# Patient Record
Sex: Female | Born: 1980 | Hispanic: Yes | Marital: Married | Smoking: Never smoker
Health system: Southern US, Community
[De-identification: ages and names within clinical notes are randomized; demographics above are authoritative.]

## PROBLEM LIST (undated history)

## (undated) ENCOUNTER — Inpatient Hospital Stay (HOSPITAL_COMMUNITY): Payer: Self-pay

## (undated) DIAGNOSIS — I10 Essential (primary) hypertension: Secondary | ICD-10-CM

## (undated) DIAGNOSIS — O24419 Gestational diabetes mellitus in pregnancy, unspecified control: Secondary | ICD-10-CM

## (undated) HISTORY — PX: CHOLECYSTECTOMY: SHX55

## (undated) HISTORY — DX: Essential (primary) hypertension: I10

---

## 2008-12-10 ENCOUNTER — Encounter: Admission: RE | Admit: 2008-12-10 | Discharge: 2008-12-10 | Payer: Self-pay | Admitting: Obstetrics and Gynecology

## 2008-12-26 ENCOUNTER — Inpatient Hospital Stay (HOSPITAL_COMMUNITY): Admission: AD | Admit: 2008-12-26 | Discharge: 2008-12-26 | Payer: Self-pay | Admitting: Obstetrics and Gynecology

## 2008-12-30 ENCOUNTER — Inpatient Hospital Stay (HOSPITAL_COMMUNITY): Admission: AD | Admit: 2008-12-30 | Discharge: 2008-12-30 | Payer: Self-pay | Admitting: Obstetrics and Gynecology

## 2008-12-31 ENCOUNTER — Inpatient Hospital Stay (HOSPITAL_COMMUNITY): Admission: AD | Admit: 2008-12-31 | Discharge: 2008-12-31 | Payer: Self-pay | Admitting: Obstetrics and Gynecology

## 2009-01-20 ENCOUNTER — Ambulatory Visit (HOSPITAL_COMMUNITY): Admission: RE | Admit: 2009-01-20 | Discharge: 2009-01-20 | Payer: Self-pay | Admitting: Obstetrics & Gynecology

## 2009-01-27 ENCOUNTER — Encounter: Payer: Self-pay | Admitting: Obstetrics & Gynecology

## 2009-01-27 ENCOUNTER — Inpatient Hospital Stay (HOSPITAL_COMMUNITY): Admission: AD | Admit: 2009-01-27 | Discharge: 2009-01-30 | Payer: Self-pay | Admitting: Obstetrics & Gynecology

## 2010-03-09 ENCOUNTER — Encounter (INDEPENDENT_AMBULATORY_CARE_PROVIDER_SITE_OTHER): Payer: Self-pay

## 2010-03-09 ENCOUNTER — Inpatient Hospital Stay (HOSPITAL_COMMUNITY): Admission: EM | Admit: 2010-03-09 | Discharge: 2010-03-10 | Payer: Self-pay | Admitting: Emergency Medicine

## 2010-12-28 LAB — URINALYSIS, ROUTINE W REFLEX MICROSCOPIC
Bilirubin Urine: NEGATIVE
Bilirubin Urine: NEGATIVE
Glucose, UA: NEGATIVE mg/dL
Hgb urine dipstick: NEGATIVE
Ketones, ur: 15 mg/dL — AB
Ketones, ur: >80 mg/dL — AB
Protein, ur: NEGATIVE mg/dL
Specific Gravity, Urine: 1.018 (ref 1.005–1.030)
Urobilinogen, UA: 0.2 mg/dL (ref 0.0–1.0)

## 2010-12-28 LAB — DIFFERENTIAL
Basophils Absolute: 0 10*3/uL (ref 0.0–0.1)
Basophils Relative: 0 % (ref 0–1)
Lymphocytes Relative: 11 % — ABNORMAL LOW (ref 12–46)
Lymphs Abs: 1.4 10*3/uL (ref 0.7–4.0)
Monocytes Absolute: 0.4 10*3/uL (ref 0.1–1.0)
Monocytes Relative: 4 % (ref 3–12)
Neutro Abs: 10.7 10*3/uL — ABNORMAL HIGH (ref 1.7–7.7)
Neutrophils Relative %: 85 % — ABNORMAL HIGH (ref 43–77)

## 2010-12-28 LAB — POCT PREGNANCY, URINE: Preg Test, Ur: NEGATIVE

## 2010-12-28 LAB — COMPREHENSIVE METABOLIC PANEL
ALT: 98 U/L — ABNORMAL HIGH (ref 0–35)
AST: 37 U/L (ref 0–37)
Chloride: 107 mEq/L (ref 96–112)
Glucose, Bld: 128 mg/dL — ABNORMAL HIGH (ref 70–99)
Total Protein: 7.2 g/dL (ref 6.0–8.3)

## 2010-12-28 LAB — URINE MICROSCOPIC-ADD ON

## 2010-12-28 LAB — CBC
HCT: 40.3 % (ref 36.0–46.0)
Hemoglobin: 13.6 g/dL (ref 12.0–15.0)
MCV: 81.3 fL (ref 78.0–100.0)
RDW: 13.9 % (ref 11.5–15.5)
WBC: 12.7 10*3/uL — ABNORMAL HIGH (ref 4.0–10.5)

## 2011-01-20 LAB — RPR: RPR Ser Ql: NONREACTIVE

## 2011-01-20 LAB — COMPREHENSIVE METABOLIC PANEL
ALT: 27 U/L (ref 0–35)
AST: 25 U/L (ref 0–37)
AST: 29 U/L (ref 0–37)
Alkaline Phosphatase: 184 U/L — ABNORMAL HIGH (ref 39–117)
Alkaline Phosphatase: 191 U/L — ABNORMAL HIGH (ref 39–117)
BUN: 9 mg/dL (ref 6–23)
BUN: 9 mg/dL (ref 6–23)
Creatinine, Ser: 0.58 mg/dL (ref 0.4–1.2)
GFR calc non Af Amer: >60 mL/min (ref >60)
GFR calc non Af Amer: >60 mL/min (ref >60)
Glucose, Bld: 92 mg/dL (ref 70–99)
Potassium: 4.4 mEq/L (ref 3.5–5.1)
Total Bilirubin: 0.3 mg/dL (ref 0.3–1.2)
Total Bilirubin: 0.4 mg/dL (ref 0.3–1.2)
Total Protein: 5.6 g/dL — ABNORMAL LOW (ref 6.0–8.3)
Total Protein: 5.9 g/dL — ABNORMAL LOW (ref 6.0–8.3)

## 2011-01-20 LAB — URINALYSIS, DIPSTICK ONLY
Bilirubin Urine: NEGATIVE
Glucose, UA: NEGATIVE mg/dL
Ketones, ur: 15 mg/dL — AB
Protein, ur: NEGATIVE mg/dL
Urobilinogen, UA: 0.2 mg/dL (ref 0.0–1.0)
pH: 6.5 (ref 5.0–8.0)

## 2011-01-20 LAB — CBC
MCV: 79.5 fL (ref 78.0–100.0)
Platelets: 255 10*3/uL (ref 150–400)
Platelets: 307 10*3/uL (ref 150–400)
RBC: 4.86 MIL/uL (ref 3.87–5.11)
WBC: 11.4 10*3/uL — ABNORMAL HIGH (ref 4.0–10.5)
WBC: 24 10*3/uL — ABNORMAL HIGH (ref 4.0–10.5)

## 2011-01-20 LAB — URINALYSIS, ROUTINE W REFLEX MICROSCOPIC
Bilirubin Urine: NEGATIVE
Glucose, UA: NEGATIVE mg/dL
Hgb urine dipstick: NEGATIVE
Specific Gravity, Urine: <1.005 — ABNORMAL LOW (ref 1.005–1.030)

## 2011-01-20 LAB — CCBB MATERNAL DONOR DRAW

## 2011-01-20 LAB — URIC ACID: Uric Acid, Serum: 3.7 mg/dL (ref 2.4–7.0)

## 2011-01-20 LAB — URINE MICROSCOPIC-ADD ON

## 2011-01-21 LAB — COMPREHENSIVE METABOLIC PANEL
ALT: 24 U/L (ref 0–35)
AST: 23 U/L (ref 0–37)
Alkaline Phosphatase: 185 U/L — ABNORMAL HIGH (ref 39–117)
GFR calc Af Amer: >60 mL/min (ref >60)
Glucose, Bld: 90 mg/dL (ref 70–99)
Potassium: 3.9 mEq/L (ref 3.5–5.1)
Sodium: 137 mEq/L (ref 135–145)
Total Protein: 6.3 g/dL (ref 6.0–8.3)

## 2011-01-21 LAB — CBC
Hemoglobin: 12.8 g/dL (ref 12.0–15.0)
RBC: 4.93 MIL/uL (ref 3.87–5.11)
RDW: 14.9 % (ref 11.5–15.5)

## 2011-01-21 LAB — LACTATE DEHYDROGENASE: LDH: 142 U/L (ref 94–250)

## 2011-07-07 IMAGING — US US ABDOMEN COMPLETE
1 series · 14 of 25 positions shown · non-contrast
Comparison: None.

CLINICAL DATA: Abdominal pain.

COMPLETE ABDOMINAL ULTRASOUND

[Series 1: us abdomen complete · 0.32mm/px · 14 of 64 slices shown]
[im 1/64]
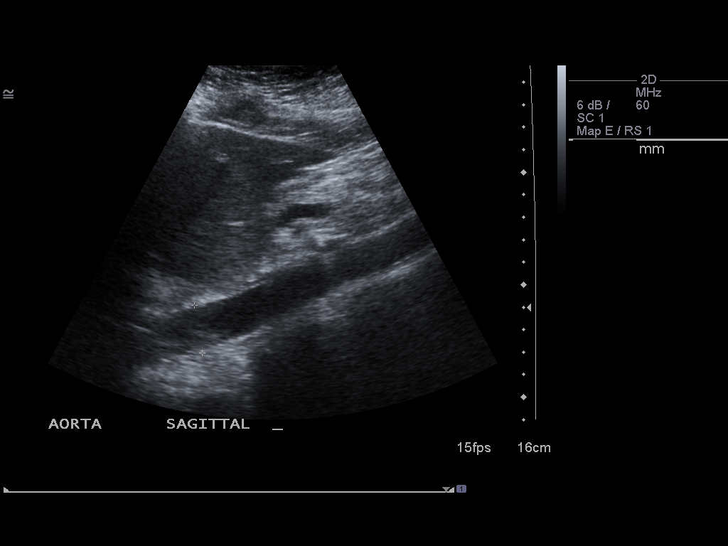
[im 6/64]
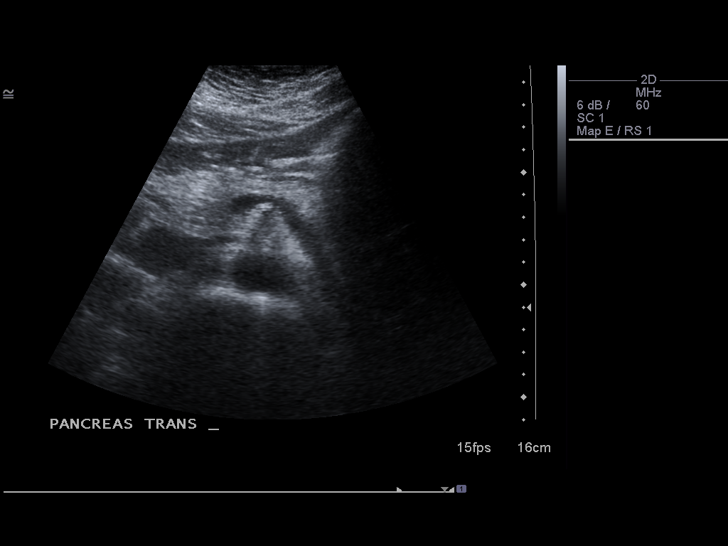
[im 11/64]
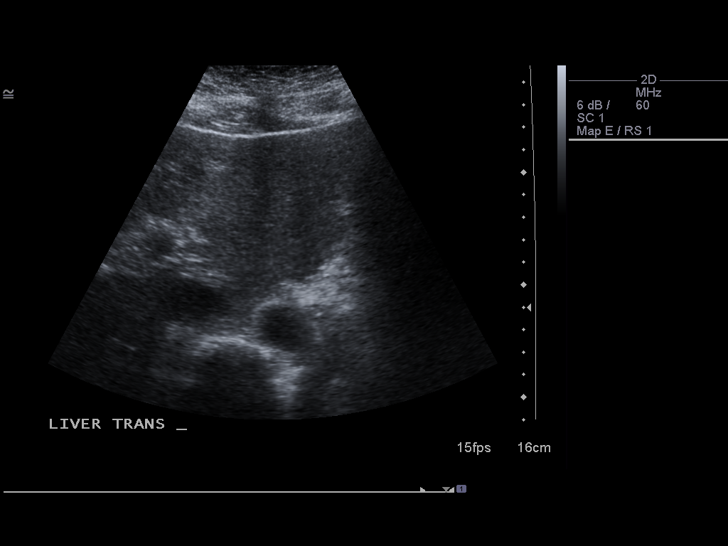
[im 16/64]
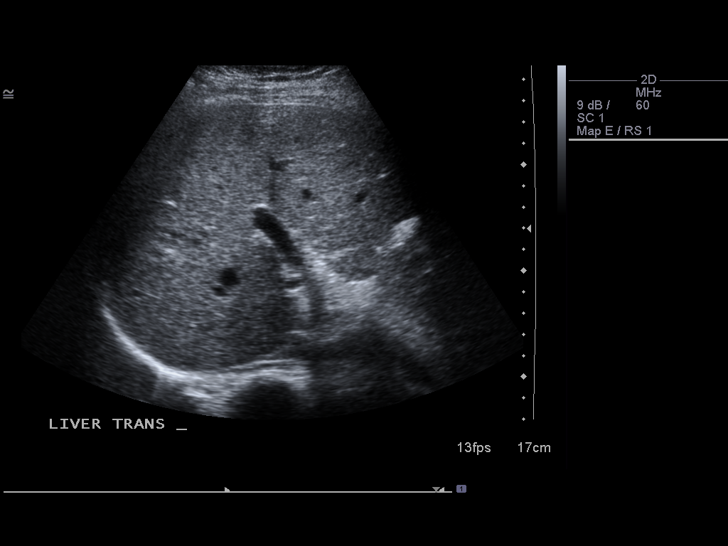
[im 22/64]
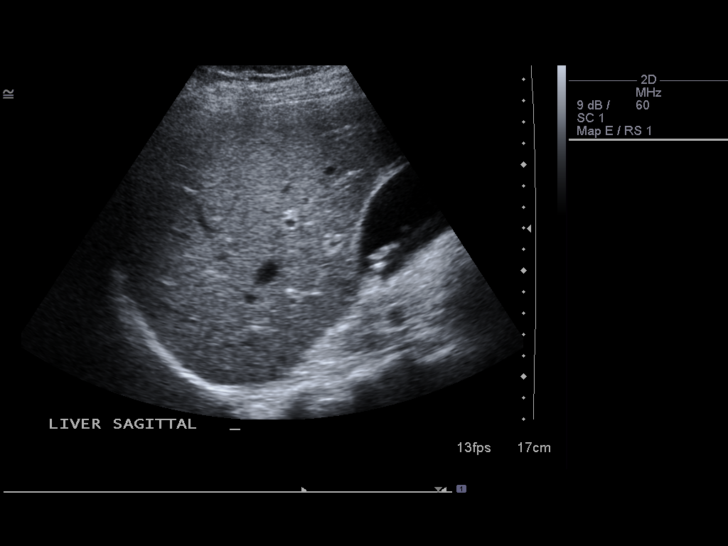
[im 24/64]
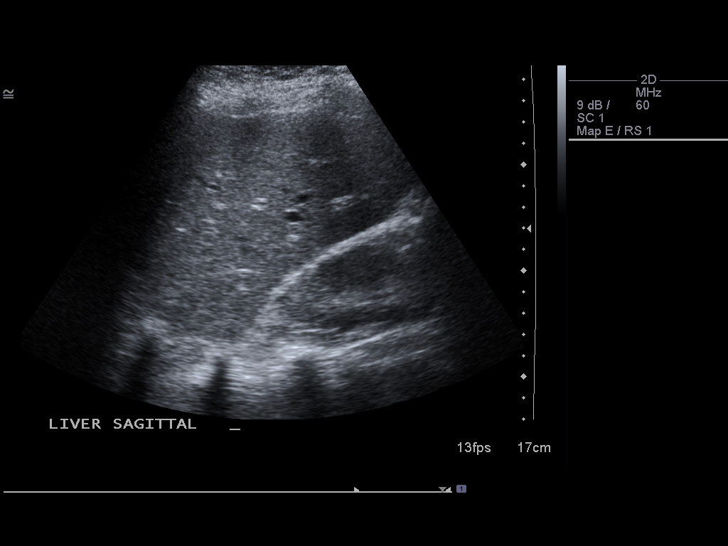
[im 29/64]
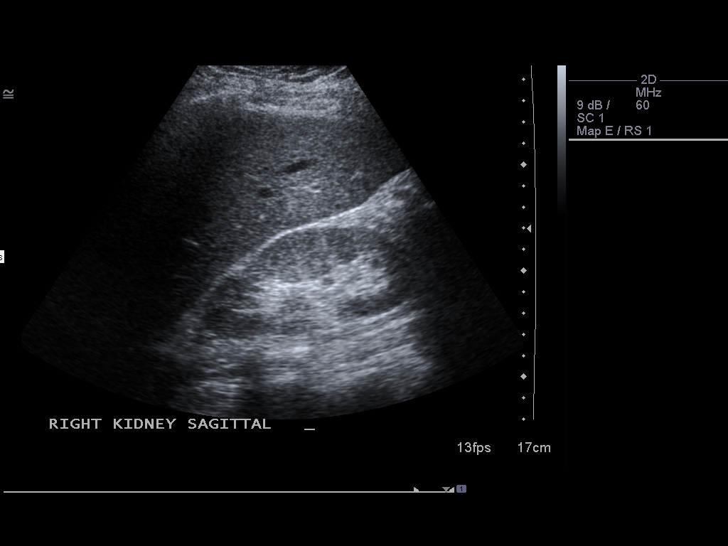
[im 35/64]
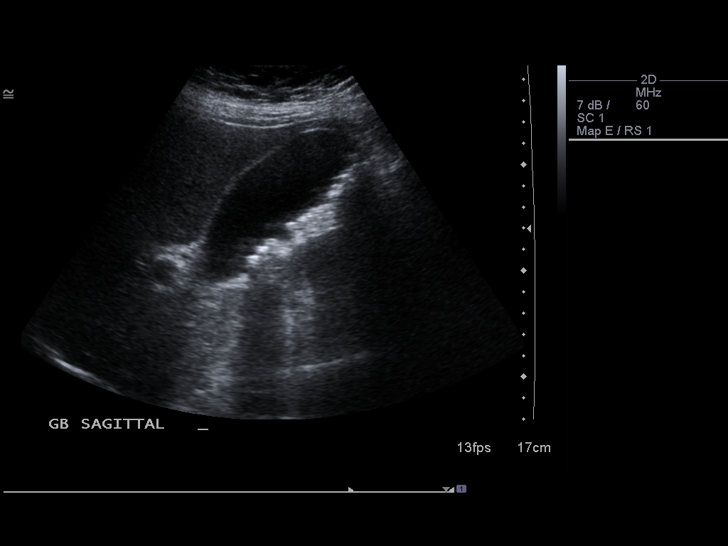
[im 40/64]
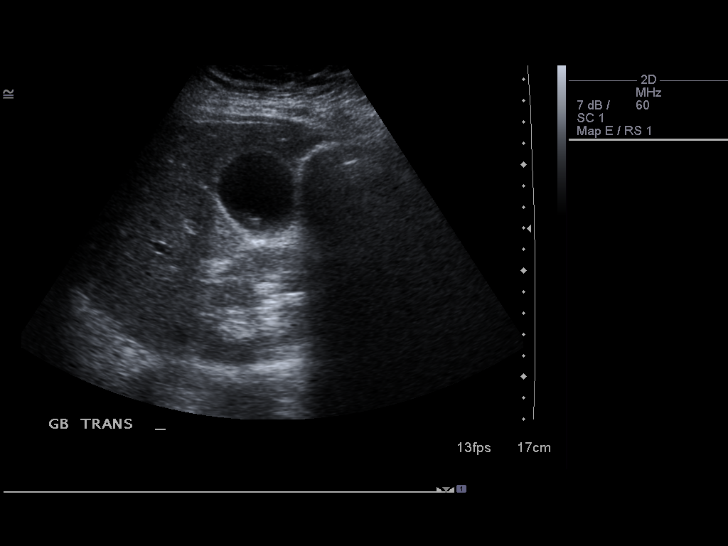
[im 43/64]
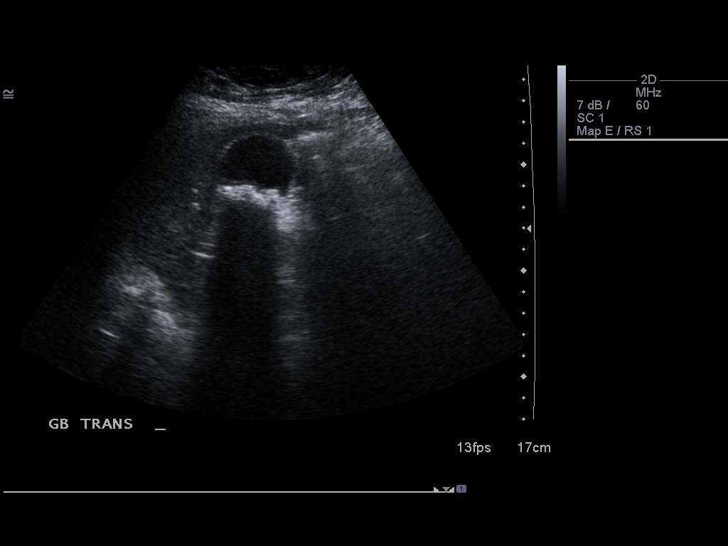
[im 48/64]
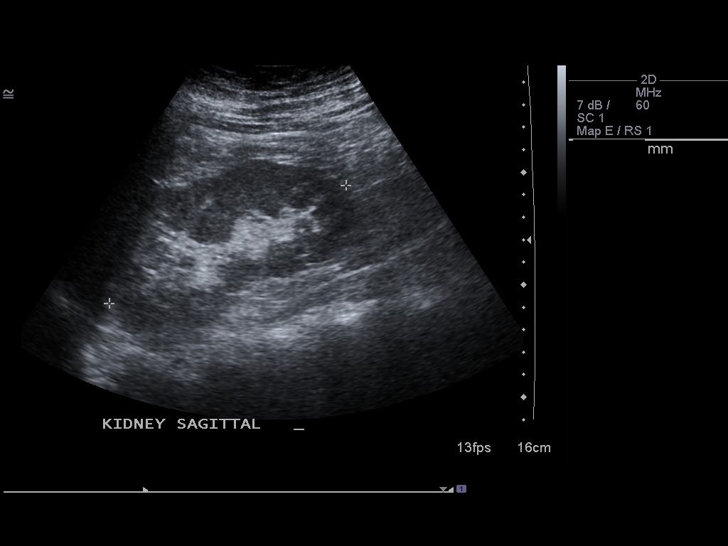
[im 53/64]
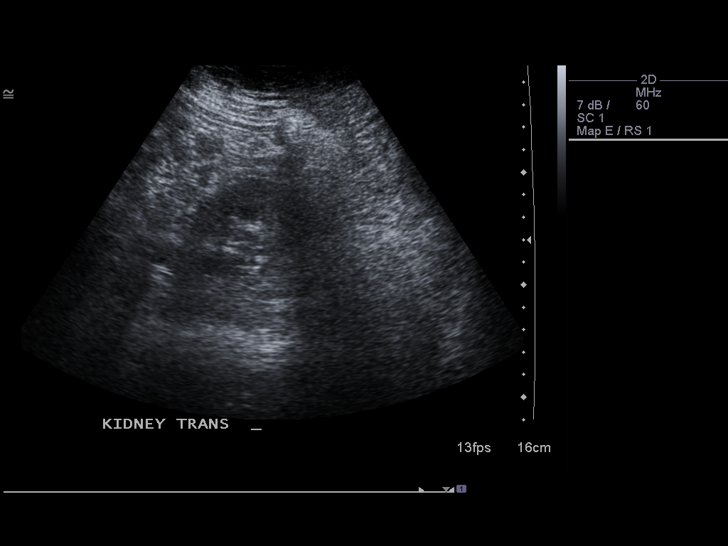
[im 58/64]
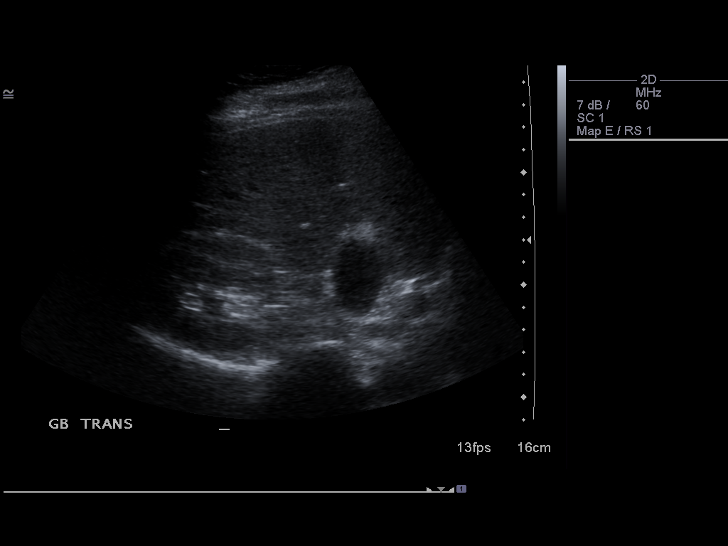
[im 64/64]
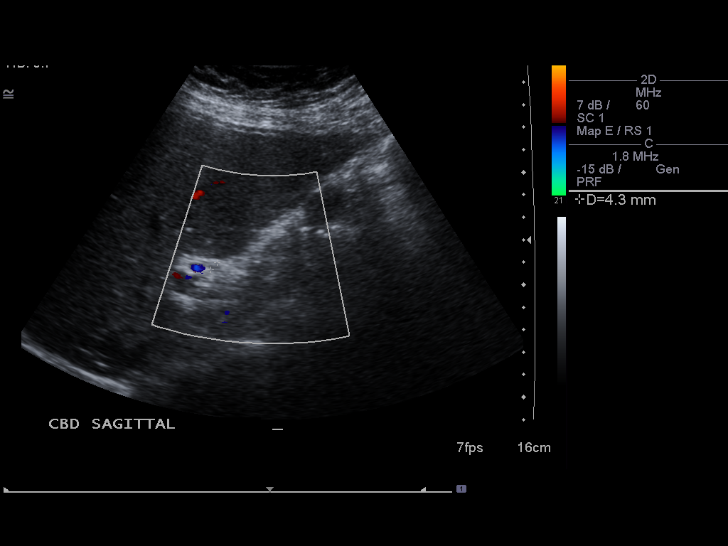

[14 of 25 positions shown; findings below may reference images not displayed]

FINDINGS: Gallbladder:  Multiple small stones are seen within the
gallbladder.  There is no pericholecystic fluid or wall thickening.
Sonographer reports right upper quadrant tenderness.

Common bile duct:  Measures 4.3 mm, normal.

Liver:  No focal lesion identified.  Within normal limits in
parenchymal echogenicity.

IVC:  Appears normal.

Pancreas:  No focal abnormality seen.

Spleen:  Measures 5.8 cm and appears normal.

Right Kidney:  Measures 11.4 cm and appears normal.

Left Kidney:  Measures 11.8 cm and appears normal.

Abdominal aorta:  No aneurysm.
IMPRESSION: Small gallstones.  The sonographer reports right upper quadrant
tenderness but there is no pericholecystic fluid or wall thickening
as is typically seen in cholecystitis.

## 2011-07-07 IMAGING — RF DG CHOLANGIOGRAM OPERATIVE
1 series · 5 of 5 positions shown · non-contrast
Comparison: Right upper quadrant ultrasound 03/09/2010.

Fluoroscopy time of 0.3 minutes was utilized.

CLINICAL DATA: 29-year-old female undergoing laparoscopic
cholecystectomy.

INTRAOPERATIVE CHOLANGIOGRAM
TECHNIQUE: Cholangiographic images from the C-arm fluoroscopic
device were submitted for interpretation post-operatively.  Please
see the procedural report for the amount of contrast and the
fluoroscopy time utilized.

[Series 1: run · 3 acquisitions, 5 frames shown]
[im 1/3]
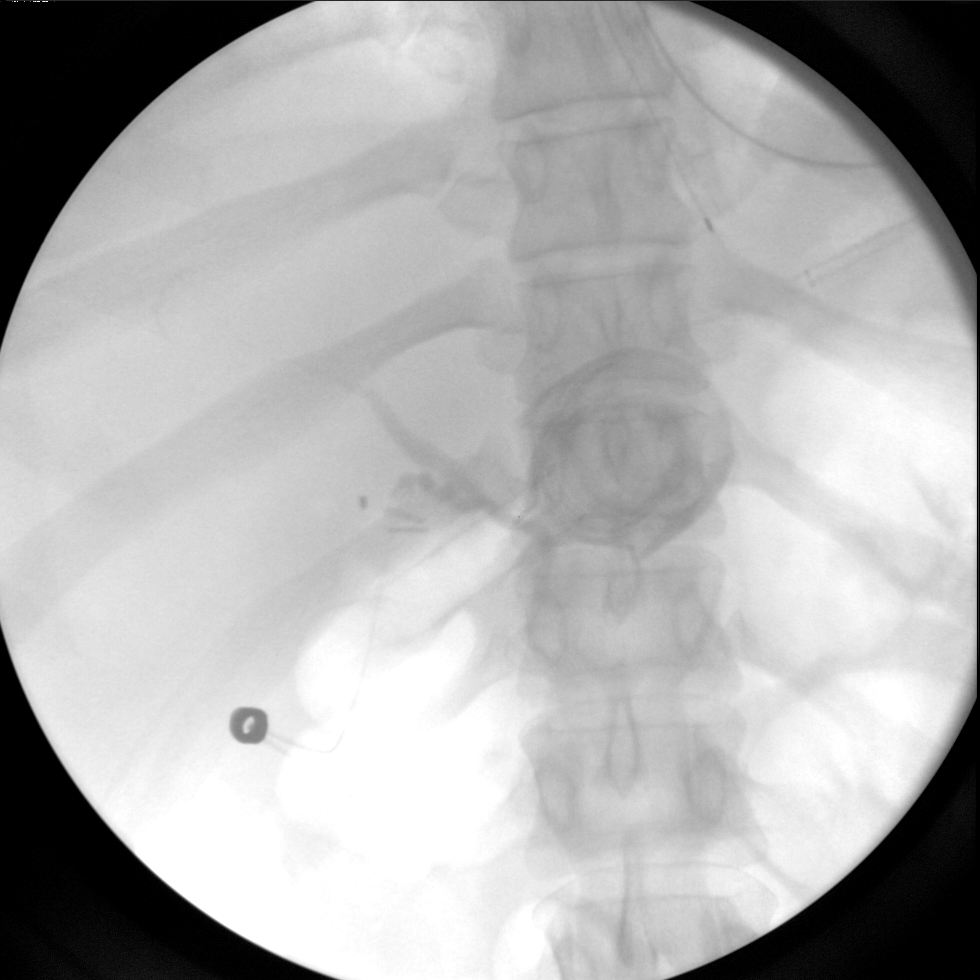
[im 1/3]
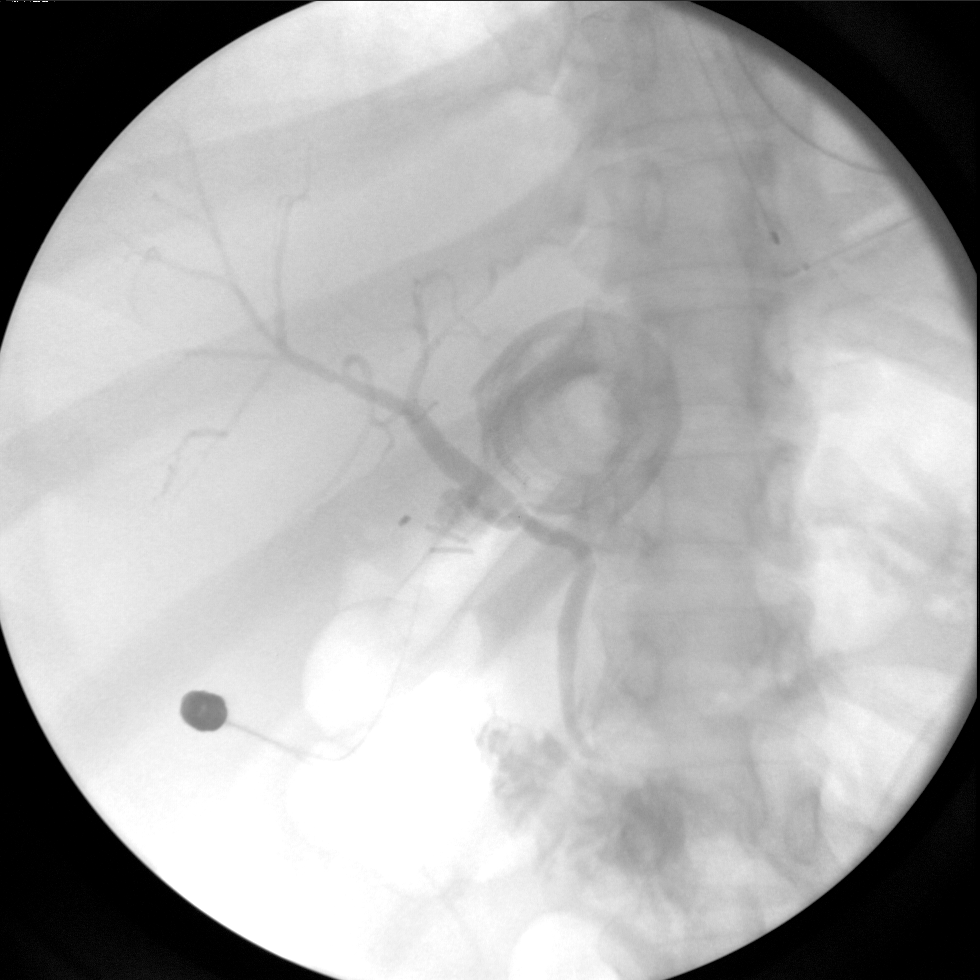
[im 1/3]
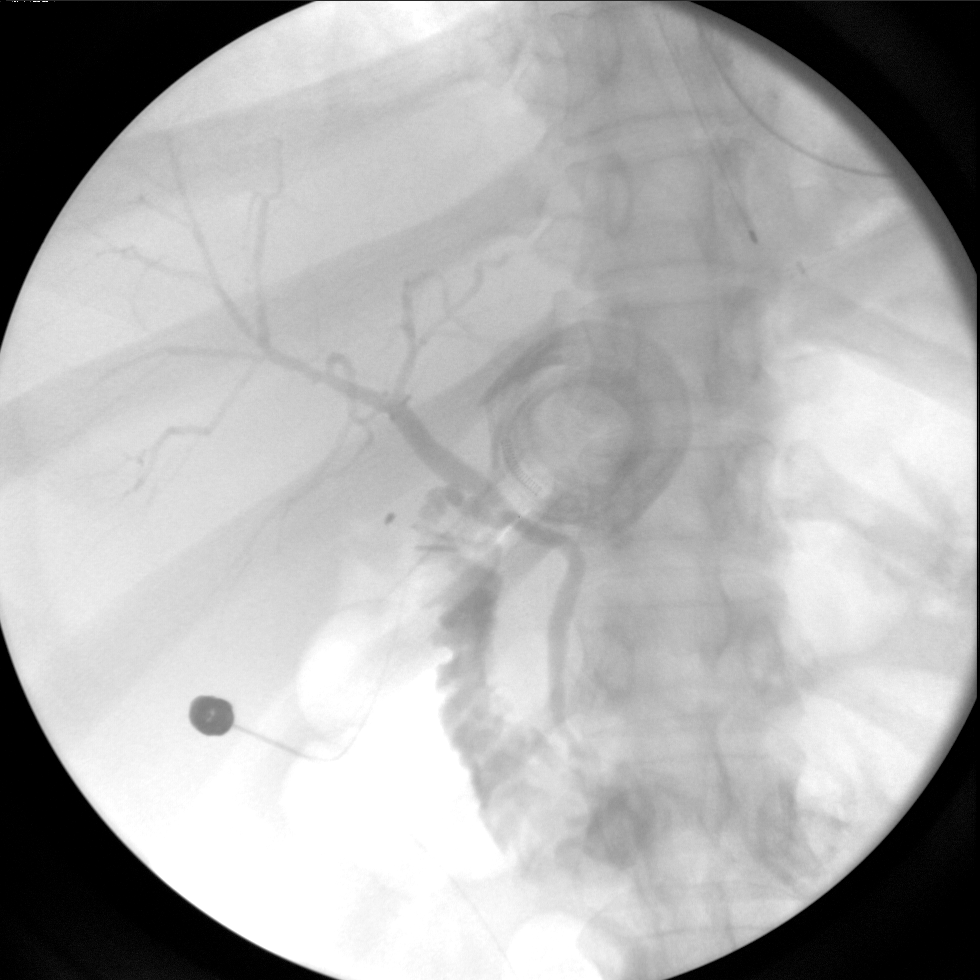
[im 2/3]
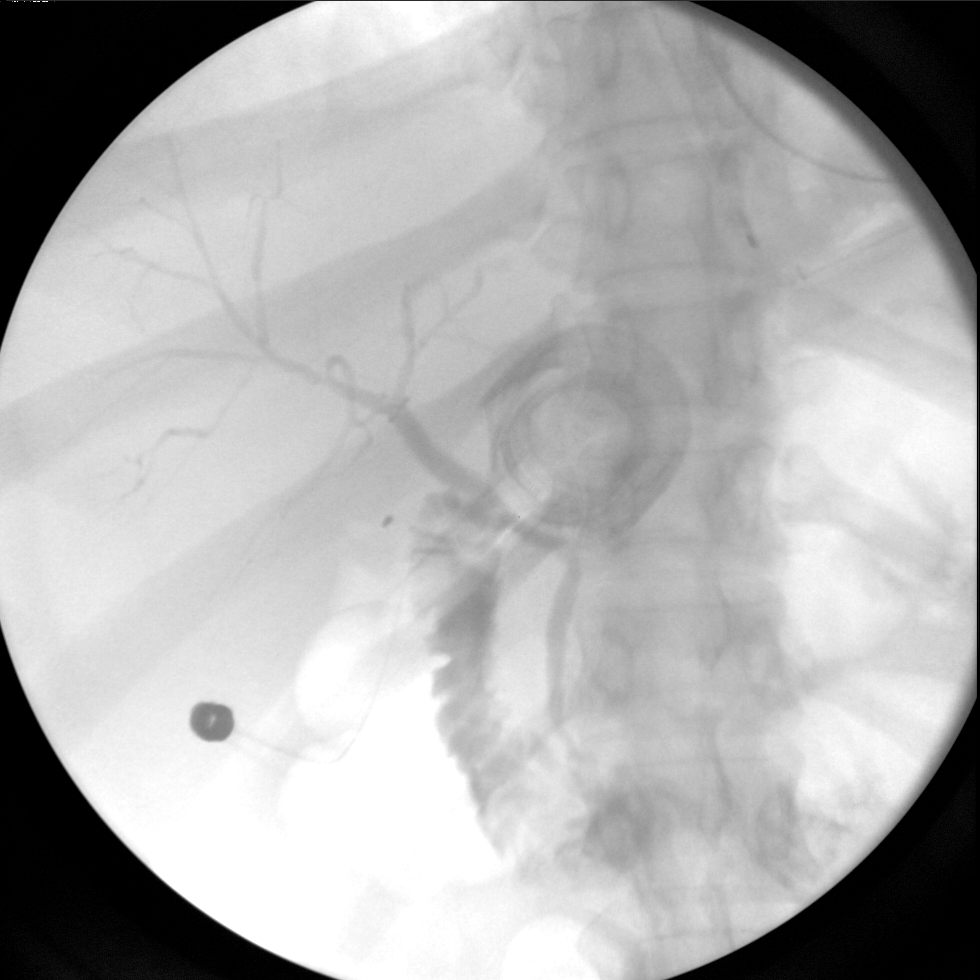
[im 3/3]
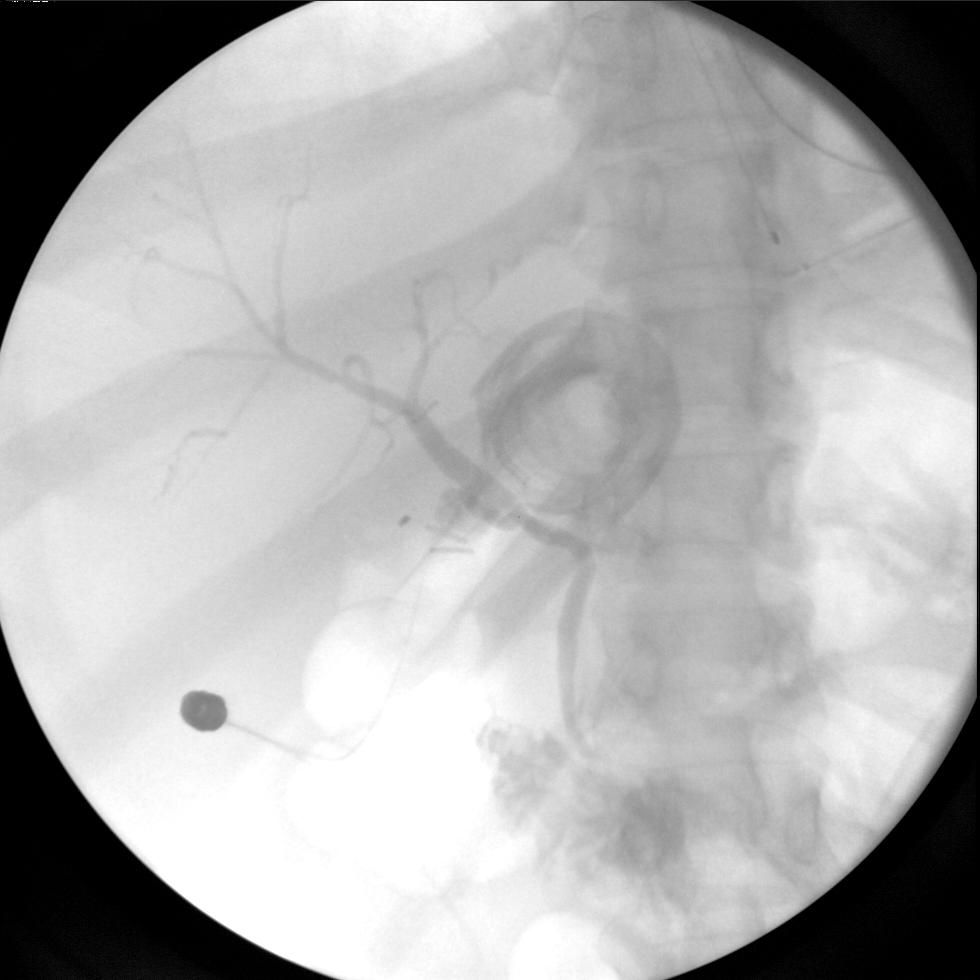

[5 of 5 positions shown; findings below may reference images not displayed]

FINDINGS: Multiple intraoperative coned-down fluoroscopic views of
the right upper quadrant.  Surgical clips at the cystic duct which
has cannulated.  Contrast is injected.  Normal intra and
extrahepatic biliary tree caliber.  Prompt contrast transit to the
duodenum.  No filling defect.
IMPRESSION: Negative intraoperative cholangiogram.

## 2012-08-05 ENCOUNTER — Ambulatory Visit: Payer: Self-pay | Admitting: Family Medicine

## 2012-08-05 VITALS — BP 152/89 | HR 82 | Temp 98.6°F | Resp 16 | Ht 64.0 in | Wt 191.0 lb

## 2012-08-05 DIAGNOSIS — R030 Elevated blood-pressure reading, without diagnosis of hypertension: Secondary | ICD-10-CM

## 2012-08-05 DIAGNOSIS — M5380 Other specified dorsopathies, site unspecified: Secondary | ICD-10-CM

## 2012-08-05 DIAGNOSIS — M62830 Muscle spasm of back: Secondary | ICD-10-CM

## 2012-08-05 MED ORDER — CYCLOBENZAPRINE HCL 10 MG PO TABS: 10.0000 mg | ORAL_TABLET | Freq: Three times a day (TID) | ORAL | Status: DC | PRN

## 2012-08-05 MED ORDER — DIAZEPAM 5 MG PO TABS: 5.0000 mg | ORAL_TABLET | Freq: Every evening | ORAL | Status: DC | PRN

## 2012-08-05 MED ORDER — METHYLPREDNISOLONE ACETATE 80 MG/ML IJ SUSP
120.0000 mg | Freq: Once | INTRAMUSCULAR | Status: AC
  Administered 2012-08-05: 120 mg via INTRAMUSCULAR

## 2012-08-05 MED ORDER — KETOROLAC TROMETHAMINE 30 MG/ML IJ SOLN
30.0000 mg | Freq: Once | INTRAMUSCULAR | Status: AC
  Administered 2012-08-05: 30 mg via INTRAMUSCULAR

## 2012-08-05 NOTE — Progress Notes (Signed)
  Subjective:    Patient ID: Gabriela Donovan, female    DOB: Feb 14, 1981, 31 y.o.   MRN: [id number]  HPI  Larey Seat onto her bottom coming down the stairs 2 mos ago and pain is currently worsening, hurts to sit, walk, lift. Has a 99 yo daughter  Pain is in low back only, so bad can't straighten legs at all but does not radiate from bilateral low back.  No past medical history on file.  Review of Systems  Constitutional: Negative for fever and chills.  Gastrointestinal: Positive for constipation. Negative for nausea, vomiting, abdominal pain and diarrhea.  Genitourinary: Negative for urgency, frequency, decreased urine volume and difficulty urinating.  Musculoskeletal: Positive for myalgias and back pain. Negative for joint swelling, arthralgias and gait problem.  Hematological: Negative for adenopathy. Does not bruise/bleed easily.      BP 152/89  Pulse 82  Temp 98.6 F (37 C)  Resp 16  Ht 5\' 4"  (1.626 m)  Wt 191 lb (86.637 kg)  BMI 32.79 kg/m2  LMP 07/15/2012 Objective:   Physical Exam  Constitutional: She is oriented to person, place, and time. She appears well-developed and well-nourished. No distress.  HENT:  Head: Normocephalic and atraumatic.  Cardiovascular: Normal rate, regular rhythm and normal heart sounds.   Pulmonary/Chest: Effort normal and breath sounds normal. No respiratory distress.  Abdominal: Soft. Bowel sounds are normal. She exhibits no distension.  Musculoskeletal: She exhibits no edema.       Lumbar back: She exhibits decreased range of motion, tenderness and spasm. She exhibits no bony tenderness, no swelling and no deformity.  Neurological: She is alert and oriented to person, place, and time. She has normal strength and normal reflexes. No sensory deficit. She exhibits abnormal muscle tone.  Reflex Scores:      Patellar reflexes are 2+ on the right side and 2+ on the left side.      Achilles reflexes are 2+ on the right side and 2+ on the left side. Skin: Skin is  warm and dry. No rash noted. She is not diaphoretic. No erythema.  Psychiatric: She has a normal mood and affect. Her behavior is normal.          Assessment & Plan:  Low back strain w/ muscle spasm - cont otc nsaids w/ prn flexeril. Few valium given for evening.  depomedrol 120 IM and toradol 30mg  IM x 1 now.  Rev importance of heat and stretching. 1. Elevated blood pressure    Likely due to pain, recheck at f/u  2.

## 2012-08-05 NOTE — Patient Instructions (Signed)
Make sure you apply heat for at least 15 minutes at least 3 times a day followed by gentle stretching.  Low Back Strain with Rehab A strain is an injury in which a tendon or muscle is torn. The muscles and tendons of the lower back are vulnerable to strains. However, these muscles and tendons are very strong and require a great force to be injured. Strains are classified into three categories. Grade 1 strains cause pain, but the tendon is not lengthened. Grade 2 strains include a lengthened ligament, due to the ligament being stretched or partially ruptured. With grade 2 strains there is still function, although the function may be decreased. Grade 3 strains involve a complete tear of the tendon or muscle, and function is usually impaired. SYMPTOMS   Pain in the lower back.  Pain that affects one side more than the other.  Pain that gets worse with movement and may be felt in the hip, buttocks, or back of the thigh.  Muscle spasms of the muscles in the back.  Swelling along the muscles of the back.  Loss of strength of the back muscles.  Crackling sound (crepitation) when the muscles are touched. CAUSES  Lower back strains occur when a force is placed on the muscles or tendons that is greater than they can handle. Common causes of injury include:  Prolonged overuse of the muscle-tendon units in the lower back, usually from incorrect posture.  A single violent injury or force applied to the back. RISK INCREASES WITH:  Sports that involve twisting forces on the spine or a lot of bending at the waist (football, rugby, weightlifting, bowling, golf, tennis, speed skating, racquetball, swimming, running, gymnastics, diving).  Poor strength and flexibility.  Failure to warm up properly before activity.  Family history of lower back pain or disk disorders.  Previous back injury or surgery (especially fusion).  Poor posture with lifting, especially heavy objects.  Prolonged sitting,  especially with poor posture. PREVENTION   Learn and use proper posture when sitting or lifting (maintain proper posture when sitting, lift using the knees and legs, not at the waist).  Warm up and stretch properly before activity.  Allow for adequate recovery between workouts.  Maintain physical fitness:  Strength, flexibility, and endurance.  Cardiovascular fitness. PROGNOSIS  If treated properly, lower back strains usually heal within 6 weeks. RELATED COMPLICATIONS   Recurring symptoms, resulting in a chronic problem.  Chronic inflammation, scarring, and partial muscle-tendon tear.  Delayed healing or resolution of symptoms.  Prolonged disability. TREATMENT  Treatment first involves the use of ice and medicine, to reduce pain and inflammation. The use of strengthening and stretching exercises may help reduce pain with activity. These exercises may be performed at home or with a therapist. Severe injuries may require referral to a therapist for further evaluation and treatment, such as ultrasound. Your caregiver may advise that you wear a back brace or corset, to help reduce pain and discomfort. Often, prolonged bed rest results in greater harm then benefit. Corticosteroid injections may be recommended. However, these should be reserved for the most serious cases. It is important to avoid using your back when lifting objects. At night, sleep on your back on a firm mattress with a pillow placed under your knees. If non-surgical treatment is unsuccessful, surgery may be needed.  MEDICATION   If pain medicine is needed, nonsteroidal anti-inflammatory medicines (aspirin and ibuprofen), or other minor pain relievers (acetaminophen), are often advised.  Do not take pain medicine for  7 days before surgery.  Prescription pain relievers may be given, if your caregiver thinks they are needed. Use only as directed and only as much as you need.  Ointments applied to the skin may be  helpful.  Corticosteroid injections may be given by your caregiver. These injections should be reserved for the most serious cases, because they may only be given a certain number of times. HEAT AND COLD  Cold treatment (icing) should be applied for 10 to 15 minutes every 2 to 3 hours for inflammation and pain, and immediately after activity that aggravates your symptoms. Use ice packs or an ice massage.  Heat treatment may be used before performing stretching and strengthening activities prescribed by your caregiver, physical therapist, or athletic trainer. Use a heat pack or a warm water soak. SEEK MEDICAL CARE IF:   Symptoms get worse or do not improve in 2 to 4 weeks, despite treatment.  You develop numbness, weakness, or loss of bowel or bladder function.  New, unexplained symptoms develop. (Drugs used in treatment may produce side effects.) EXERCISES  RANGE OF MOTION (ROM) AND STRETCHING EXERCISES - Low Back Strain Most people with lower back pain will find that their symptoms get worse with excessive bending forward (flexion) or arching at the lower back (extension). The exercises which will help resolve your symptoms will focus on the opposite motion.  Your physician, physical therapist or athletic trainer will help you determine which exercises will be most helpful to resolve your lower back pain. Do not complete any exercises without first consulting with your caregiver. Discontinue any exercises which make your symptoms worse until you speak to your caregiver.  If you have pain, numbness or tingling which travels down into your buttocks, leg or foot, the goal of the therapy is for these symptoms to move closer to your back and eventually resolve. Sometimes, these leg symptoms will get better, but your lower back pain may worsen. This is typically an indication of progress in your rehabilitation. Be very alert to any changes in your symptoms and the activities in which you participated  in the 24 hours prior to the change. Sharing this information with your caregiver will allow him/her to most efficiently treat your condition.  These exercises may help you when beginning to rehabilitate your injury. Your symptoms may resolve with or without further involvement from your physician, physical therapist or athletic trainer. While completing these exercises, remember:  Restoring tissue flexibility helps normal motion to return to the joints. This allows healthier, less painful movement and activity.  An effective stretch should be held for at least 30 seconds.  A stretch should never be painful. You should only feel a gentle lengthening or release in the stretched tissue. FLEXION RANGE OF MOTION AND STRETCHING EXERCISES: STRETCH  Flexion, Single Knee to Chest   Lie on a firm bed or floor with both legs extended in front of you.  Keeping one leg in contact with the floor, bring your opposite knee to your chest. Hold your leg in place by either grabbing behind your thigh or at your knee.  Pull until you feel a gentle stretch in your lower back. Hold __________ seconds.  Slowly release your grasp and repeat the exercise with the opposite side. Repeat __________ times. Complete this exercise __________ times per day.  STRETCH  Flexion, Double Knee to Chest   Lie on a firm bed or floor with both legs extended in front of you.  Keeping one leg in contact  with the floor, bring your opposite knee to your chest.  Tense your stomach muscles to support your back and then lift your other knee to your chest. Hold your legs in place by either grabbing behind your thighs or at your knees.  Pull both knees toward your chest until you feel a gentle stretch in your lower back. Hold __________ seconds.  Tense your stomach muscles and slowly return one leg at a time to the floor. Repeat __________ times. Complete this exercise __________ times per day.  STRETCH  Low Trunk Rotation  Lie on  a firm bed or floor. Keeping your legs in front of you, bend your knees so they are both pointed toward the ceiling and your feet are flat on the floor.  Extend your arms out to the side. This will stabilize your upper body by keeping your shoulders in contact with the floor.  Gently and slowly drop both knees together to one side until you feel a gentle stretch in your lower back. Hold for __________ seconds.  Tense your stomach muscles to support your lower back as you bring your knees back to the starting position. Repeat the exercise to the other side. Repeat __________ times. Complete this exercise __________ times per day  EXTENSION RANGE OF MOTION AND FLEXIBILITY EXERCISES: STRETCH  Extension, Prone on Elbows   Lie on your stomach on the floor, a bed will be too soft. Place your palms about shoulder width apart and at the height of your head.  Place your elbows under your shoulders. If this is too painful, stack pillows under your chest.  Allow your body to relax so that your hips drop lower and make contact more completely with the floor.  Hold this position for __________ seconds.  Slowly return to lying flat on the floor. Repeat __________ times. Complete this exercise __________ times per day.  RANGE OF MOTION  Extension, Prone Press Ups  Lie on your stomach on the floor, a bed will be too soft. Place your palms about shoulder width apart and at the height of your head.  Keeping your back as relaxed as possible, slowly straighten your elbows while keeping your hips on the floor. You may adjust the placement of your hands to maximize your comfort. As you gain motion, your hands will come more underneath your shoulders.  Hold this position __________ seconds.  Slowly return to lying flat on the floor. Repeat __________ times. Complete this exercise __________ times per day.  RANGE OF MOTION- Quadruped, Neutral Spine   Assume a hands and knees position on a firm surface. Keep  your hands under your shoulders and your knees under your hips. You may place padding under your knees for comfort.  Drop your head and point your tail bone toward the ground below you. This will round out your lower back like an angry cat. Hold this position for __________ seconds.  Slowly lift your head and release your tail bone so that your back sags into a large arch, like an old horse.  Hold this position for __________ seconds.  Repeat this until you feel limber in your lower back.  Now, find your "sweet spot." This will be the most comfortable position somewhere between the two previous positions. This is your neutral spine. Once you have found this position, tense your stomach muscles to support your lower back.  Hold this position for __________ seconds. Repeat __________ times. Complete this exercise __________ times per day.  STRENGTHENING EXERCISES - Low  Back Strain These exercises may help you when beginning to rehabilitate your injury. These exercises should be done near your "sweet spot." This is the neutral, low-back arch, somewhere between fully rounded and fully arched, that is your least painful position. When performed in this safe range of motion, these exercises can be used for people who have either a flexion or extension based injury. These exercises may resolve your symptoms with or without further involvement from your physician, physical therapist or athletic trainer. While completing these exercises, remember:   Muscles can gain both the endurance and the strength needed for everyday activities through controlled exercises.  Complete these exercises as instructed by your physician, physical therapist or athletic trainer. Increase the resistance and repetitions only as guided.  You may experience muscle soreness or fatigue, but the pain or discomfort you are trying to eliminate should never worsen during these exercises. If this pain does worsen, stop and make certain  you are following the directions exactly. If the pain is still present after adjustments, discontinue the exercise until you can discuss the trouble with your caregiver. STRENGTHENING Deep Abdominals, Pelvic Tilt  Lie on a firm bed or floor. Keeping your legs in front of you, bend your knees so they are both pointed toward the ceiling and your feet are flat on the floor.  Tense your lower abdominal muscles to press your lower back into the floor. This motion will rotate your pelvis so that your tail bone is scooping upwards rather than pointing at your feet or into the floor.  With a gentle tension and even breathing, hold this position for __________ seconds. Repeat __________ times. Complete this exercise __________ times per day.  STRENGTHENING  Abdominals, Crunches   Lie on a firm bed or floor. Keeping your legs in front of you, bend your knees so they are both pointed toward the ceiling and your feet are flat on the floor. Cross your arms over your chest.  Slightly tip your chin down without bending your neck.  Tense your abdominals and slowly lift your trunk high enough to just clear your shoulder blades. Lifting higher can put excessive stress on the lower back and does not further strengthen your abdominal muscles.  Control your return to the starting position. Repeat __________ times. Complete this exercise __________ times per day.  STRENGTHENING  Quadruped, Opposite UE/LE Lift   Assume a hands and knees position on a firm surface. Keep your hands under your shoulders and your knees under your hips. You may place padding under your knees for comfort.  Find your neutral spine and gently tense your abdominal muscles so that you can maintain this position. Your shoulders and hips should form a rectangle that is parallel with the floor and is not twisted.  Keeping your trunk steady, lift your right hand no higher than your shoulder and then your left leg no higher than your hip. Make  sure you are not holding your breath. Hold this position __________ seconds.  Continuing to keep your abdominal muscles tense and your back steady, slowly return to your starting position. Repeat with the opposite arm and leg. Repeat __________ times. Complete this exercise __________ times per day.  STRENGTHENING  Lower Abdominals, Double Knee Lift  Lie on a firm bed or floor. Keeping your legs in front of you, bend your knees so they are both pointed toward the ceiling and your feet are flat on the floor.  Tense your abdominal muscles to brace your lower back  and slowly lift both of your knees until they come over your hips. Be certain not to hold your breath.  Hold __________ seconds. Using your abdominal muscles, return to the starting position in a slow and controlled manner. Repeat __________ times. Complete this exercise __________ times per day.  POSTURE AND BODY MECHANICS CONSIDERATIONS - Low Back Strain Keeping correct posture when sitting, standing or completing your activities will reduce the stress put on different body tissues, allowing injured tissues a chance to heal and limiting painful experiences. The following are general guidelines for improved posture. Your physician or physical therapist will provide you with any instructions specific to your needs. While reading these guidelines, remember:  The exercises prescribed by your provider will help you have the flexibility and strength to maintain correct postures.  The correct posture provides the best environment for your joints to work. All of your joints have less wear and tear when properly supported by a spine with good posture. This means you will experience a healthier, less painful body.  Correct posture must be practiced with all of your activities, especially prolonged sitting and standing. Correct posture is as important when doing repetitive low-stress activities (typing) as it is when doing a single heavy-load  activity (lifting). RESTING POSITIONS Consider which positions are most painful for you when choosing a resting position. If you have pain with flexion-based activities (sitting, bending, stooping, squatting), choose a position that allows you to rest in a less flexed posture. You would want to avoid curling into a fetal position on your side. If your pain worsens with extension-based activities (prolonged standing, working overhead), avoid resting in an extended position such as sleeping on your stomach. Most people will find more comfort when they rest with their spine in a more neutral position, neither too rounded nor too arched. Lying on a non-sagging bed on your side with a pillow between your knees, or on your back with a pillow under your knees will often provide some relief. Keep in mind, being in any one position for a prolonged period of time, no matter how correct your posture, can still lead to stiffness. PROPER SITTING POSTURE In order to minimize stress and discomfort on your spine, you must sit with correct posture. Sitting with good posture should be effortless for a healthy body. Returning to good posture is a gradual process. Many people can work toward this most comfortably by using various supports until they have the flexibility and strength to maintain this posture on their own. When sitting with proper posture, your ears will fall over your shoulders and your shoulders will fall over your hips. You should use the back of the chair to support your upper back. Your lower back will be in a neutral position, just slightly arched. You may place a small pillow or folded towel at the base of your lower back for support.  When working at a desk, create an environment that supports good, upright posture. Without extra support, muscles tire, which leads to excessive strain on joints and other tissues. Keep these recommendations in mind: CHAIR:  A chair should be able to slide under your desk  when your back makes contact with the back of the chair. This allows you to work closely.  The chair's height should allow your eyes to be level with the upper part of your monitor and your hands to be slightly lower than your elbows. BODY POSITION  Your feet should make contact with the floor. If this is  not possible, use a foot rest.  Keep your ears over your shoulders. This will reduce stress on your neck and lower back. INCORRECT SITTING POSTURES  If you are feeling tired and unable to assume a healthy sitting posture, do not slouch or slump. This puts excessive strain on your back tissues, causing more damage and pain. Healthier options include:  Using more support, like a lumbar pillow.  Switching tasks to something that requires you to be upright or walking.  Talking a brief walk.  Lying down to rest in a neutral-spine position. PROLONGED STANDING WHILE SLIGHTLY LEANING FORWARD  When completing a task that requires you to lean forward while standing in one place for a long time, place either foot up on a stationary 2-4 inch high object to help maintain the best posture. When both feet are on the ground, the lower back tends to lose its slight inward curve. If this curve flattens (or becomes too large), then the back and your other joints will experience too much stress, tire more quickly, and can cause pain. CORRECT STANDING POSTURES Proper standing posture should be assumed with all daily activities, even if they only take a few moments, like when brushing your teeth. As in sitting, your ears should fall over your shoulders and your shoulders should fall over your hips. You should keep a slight tension in your abdominal muscles to brace your spine. Your tailbone should point down to the ground, not behind your body, resulting in an over-extended swayback posture.  INCORRECT STANDING POSTURES  Common incorrect standing postures include a forward head, locked knees and/or an excessive  swayback. WALKING Walk with an upright posture. Your ears, shoulders and hips should all line-up. PROLONGED ACTIVITY IN A FLEXED POSITION When completing a task that requires you to bend forward at your waist or lean over a low surface, try to find a way to stabilize 3 out of 4 of your limbs. You can place a hand or elbow on your thigh or rest a knee on the surface you are reaching across. This will provide you more stability so that your muscles do not fatigue as quickly. By keeping your knees relaxed, or slightly bent, you will also reduce stress across your lower back. CORRECT LIFTING TECHNIQUES DO :   Assume a wide stance. This will provide you more stability and the opportunity to get as close as possible to the object which you are lifting.  Tense your abdominals to brace your spine. Bend at the knees and hips. Keeping your back locked in a neutral-spine position, lift using your leg muscles. Lift with your legs, keeping your back straight.  Test the weight of unknown objects before attempting to lift them.  Try to keep your elbows locked down at your sides in order get the best strength from your shoulders when carrying an object.  Always ask for help when lifting heavy or awkward objects. INCORRECT LIFTING TECHNIQUES DO NOT:   Lock your knees when lifting, even if it is a small object.  Bend and twist. Pivot at your feet or move your feet when needing to change directions.  Assume that you can safely pick up even a paper clip without proper posture. Document Released: 09/27/2005 Document Revised: 12/20/2011 Document Reviewed: 01/09/2009 [facility] Patient Information 2013 [place], [place].   Hypertension As your heart beats, it forces blood through your arteries. This force is your blood pressure. If the pressure is too high, it is called hypertension (HTN) or high blood pressure.  HTN is dangerous because you may have it and not know it. High blood pressure may mean that your heart  has to work harder to pump blood. Your arteries may be narrow or stiff. The extra work puts you at risk for heart disease, stroke, and other problems.  Blood pressure consists of two numbers, a higher number over a lower, 110/72, for example. It is stated as "110 over 72." The ideal is below 120 for the top number (systolic) and under 80 for the bottom (diastolic). Write down your blood pressure today. You should pay close attention to your blood pressure if you have certain conditions such as:  Heart failure.  Prior heart attack.  Diabetes  Chronic kidney disease.  Prior stroke.  Multiple risk factors for heart disease. To see if you have HTN, your blood pressure should be measured while you are seated with your arm held at the level of the heart. It should be measured at least twice. A one-time elevated blood pressure reading (especially in the Emergency Department) does not mean that you need treatment. There may be conditions in which the blood pressure is different between your right and left arms. It is important to see your caregiver soon for a recheck. Most people have essential hypertension which means that there is not a specific cause. This type of high blood pressure may be lowered by changing lifestyle factors such as:  Stress.  Smoking.  Lack of exercise.  Excessive weight.  Drug/tobacco/alcohol use.  Eating less salt. Most people do not have symptoms from high blood pressure until it has caused damage to the body. Effective treatment can often prevent, delay or reduce that damage. TREATMENT  When a cause has been identified, treatment for high blood pressure is directed at the cause. There are a large number of medications to treat HTN. These fall into several categories, and your caregiver will help you select the medicines that are best for you. Medications may have side effects. You should review side effects with your caregiver. If your blood pressure stays high  after you have made lifestyle changes or started on medicines,   Your medication(s) may need to be changed.  Other problems may need to be addressed.  Be certain you understand your prescriptions, and know how and when to take your medicine.  Be sure to follow up with your caregiver within the time frame advised (usually within two weeks) to have your blood pressure rechecked and to review your medications.  If you are taking more than one medicine to lower your blood pressure, make sure you know how and at what times they should be taken. Taking two medicines at the same time can result in blood pressure that is too low. SEEK IMMEDIATE MEDICAL CARE IF:  You develop a severe headache, blurred or changing vision, or confusion.  You have unusual weakness or numbness, or a faint feeling.  You have severe chest or abdominal pain, vomiting, or breathing problems. MAKE SURE YOU:   Understand these instructions.  Will watch your condition.  Will get help right away if you are not doing well or get worse. Document Released: 09/27/2005 Document Revised: 12/20/2011 Document Reviewed: 05/17/2008 [facility] Patient Information 2013 [place], [place].

## 2014-08-01 LAB — OB RESULTS CONSOLE GC/CHLAMYDIA
CHLAMYDIA, DNA PROBE: NEGATIVE
Gonorrhea: NEGATIVE

## 2014-09-02 LAB — OB RESULTS CONSOLE ABO/RH: RH Type: POSITIVE

## 2014-09-02 LAB — OB RESULTS CONSOLE HEPATITIS B SURFACE ANTIGEN: Hepatitis B Surface Ag: NEGATIVE

## 2014-09-02 LAB — OB RESULTS CONSOLE RUBELLA ANTIBODY, IGM: Rubella: IMMUNE

## 2014-09-02 LAB — OB RESULTS CONSOLE RPR: RPR: NONREACTIVE

## 2014-09-02 LAB — OB RESULTS CONSOLE HIV ANTIBODY (ROUTINE TESTING): HIV: NONREACTIVE

## 2014-09-02 LAB — OB RESULTS CONSOLE ANTIBODY SCREEN: Antibody Screen: NEGATIVE

## 2014-10-11 NOTE — L&D Delivery Note (Signed)
Pt had an amniotomy this am with clear fluid. She then rapidly progressed to C/C/+3. She pushed x 1 and had a SVD of one live viable infant over a second degree midline tear. Placenta-S/I. EBL-400cc. Baby to NBN. Tear closed with 3-0 chromic.

## 2015-01-14 ENCOUNTER — Inpatient Hospital Stay (HOSPITAL_COMMUNITY)
Admission: AD | Admit: 2015-01-14 | Discharge: 2015-01-14 | Disposition: A | Discharge status: Home / Self Care | Payer: Medicaid Other | Source: Ambulatory Visit | Attending: Obstetrics | Admitting: Obstetrics

## 2015-01-14 ENCOUNTER — Encounter (HOSPITAL_COMMUNITY): Payer: Self-pay | Admitting: *Deleted

## 2015-01-14 DIAGNOSIS — M549 Dorsalgia, unspecified: Secondary | ICD-10-CM | POA: Diagnosis not present

## 2015-01-14 DIAGNOSIS — O9989 Other specified diseases and conditions complicating pregnancy, childbirth and the puerperium: Secondary | ICD-10-CM | POA: Diagnosis not present

## 2015-01-14 DIAGNOSIS — R102 Pelvic and perineal pain: Secondary | ICD-10-CM | POA: Insufficient documentation

## 2015-01-14 DIAGNOSIS — O26893 Other specified pregnancy related conditions, third trimester: Secondary | ICD-10-CM

## 2015-01-14 DIAGNOSIS — Z3A30 30 weeks gestation of pregnancy: Secondary | ICD-10-CM | POA: Insufficient documentation

## 2015-01-14 DIAGNOSIS — O99891 Other specified diseases and conditions complicating pregnancy: Secondary | ICD-10-CM

## 2015-01-14 LAB — URINALYSIS, ROUTINE W REFLEX MICROSCOPIC
BILIRUBIN URINE: NEGATIVE
Glucose, UA: NEGATIVE mg/dL
HGB URINE DIPSTICK: NEGATIVE
Ketones, ur: NEGATIVE mg/dL
Leukocytes, UA: NEGATIVE
Nitrite: NEGATIVE
Protein, ur: NEGATIVE mg/dL
SPECIFIC GRAVITY, URINE: 1.015 (ref 1.005–1.030)
UROBILINOGEN UA: 0.2 mg/dL (ref 0.0–1.0)
pH: 5.5 (ref 5.0–8.0)

## 2015-01-14 MED ORDER — ACETAMINOPHEN 500 MG PO TABS
1000.0000 mg | ORAL_TABLET | Freq: Once | ORAL | Status: AC
  Administered 2015-01-14: 1000 mg via ORAL
  Filled 2015-01-14: qty 2

## 2015-01-14 NOTE — MAU Provider Note (Signed)
History     CSN: [id number]  Arrival date and time: 01/14/15 4098   First Provider Initiated Contact with Patient 01/14/15 [phone]      Chief Complaint  Patient presents with  . Vaginal Pain  . Back Pain   HPI Comments: Gabriela Donovan is a 34 y.o. G3P1011 at [redacted]w[redacted]d who presents today with back and vaginal pain. She states that the pain started, and awoke her at 0000. She states that initially the pain was 10/10. However, since coming here she has had a BM, and now the pain is much better. She denies any VB, LOF or contractions. She states that the fetus has been moving normally.   Back Pain This is a new problem. The current episode started today. The problem occurs constantly. The problem has been gradually improving since onset. The pain is present in the lumbar spine and gluteal. The quality of the pain is described as cramping. The pain is at a severity of 6/10. Pertinent negatives include no abdominal pain, dysuria, fever or headaches. She has tried nothing for the symptoms.    Past Medical History  Diagnosis Date  . Hypertension     Past Surgical History  Procedure Laterality Date  . Cholecystectomy      No family history on file.  History  Substance Use Topics  . Smoking status: Never Smoker   . Smokeless tobacco: Never Used  . Alcohol Use: No    Allergies: No Known Allergies  Prescriptions prior to admission  Medication Sig Dispense Refill Last Dose  . LABETALOL HCL PO Take 1 tablet by mouth 2 (two) times daily.   01/13/2015 at Unknown time  . Prenatal MV-Min-Fe Fum-FA-DHA (PRENATAL 1 PO) Take 1 tablet by mouth.   01/13/2015 at Unknown time  . cyclobenzaprine (FLEXERIL) 10 MG tablet Take 1 tablet (10 mg total) by mouth 3 (three) times daily as needed for muscle spasms. 30 tablet 2 More than a month at Unknown time  . diazepam (VALIUM) 5 MG tablet Take 1 tablet (5 mg total) by mouth at bedtime as needed (muscle spasm). 15 tablet 0   . UNABLE TO FIND BCP---given in  [place]       Review of Systems  Constitutional: Negative for fever.  Gastrointestinal: Positive for constipation (had a smalll BM today, and last BM prior to that was about 3 days ago. ). Negative for nausea, vomiting, abdominal pain and diarrhea.  Genitourinary: Negative for dysuria, urgency and frequency.  Musculoskeletal: Positive for back pain.  Neurological: Negative for headaches.   Physical Exam   Blood pressure 137/83, pulse 82, temperature 98.4 F (36.9 C), temperature source Oral, resp. rate 18, last menstrual period 06/17/2014.  Physical Exam  Nursing note and vitals reviewed. Constitutional: She is oriented to person, place, and time. She appears well-developed and well-nourished. No distress.  Cardiovascular: Normal rate.   Respiratory: Effort normal.  GI: Soft. There is no tenderness. There is no rebound.  Genitourinary:   Cervix: closed/thick/high   Neurological: She is alert and oriented to person, place, and time.  Skin: Skin is warm and dry.  Psychiatric: She has a normal mood and affect.   FHT: 140, moderate with 15x15 accels, no decels Toco: no UCs   Results for orders placed or performed during the hospital encounter of 01/14/15 (from the past 24 hour(s))  Urinalysis, Routine w reflex microscopic     Status: None   Collection Time: 01/14/15  3:15 AM  Result Value Ref Range  Color, Urine YELLOW YELLOW   APPearance CLEAR CLEAR   Specific Gravity, Urine 1.015 1.005 - 1.030   pH 5.5 5.0 - 8.0   Glucose, UA NEGATIVE NEGATIVE mg/dL   Hgb urine dipstick NEGATIVE NEGATIVE   Bilirubin Urine NEGATIVE NEGATIVE   Ketones, ur NEGATIVE NEGATIVE mg/dL   Protein, ur NEGATIVE NEGATIVE mg/dL   Urobilinogen, UA 0.2 0.0 - 1.0 mg/dL   Nitrite NEGATIVE NEGATIVE   Leukocytes, UA NEGATIVE NEGATIVE    MAU Course  Procedures  MDM 16100442: Patient reports pain is 0/10 after tylenol.  [id number]: D/W Dr. Chestine Sporelark, ok for DC home.   Assessment and Plan   1. Back  pain affecting pregnancy in third trimester    DC home Comfort measures reviewed Tylenol PRN PTL precautions  Fetal kick counts   Follow-up Information    [facility] Ley[facility]EL CL[facility], MD.   Specialty:  Obstetrics   Why:  As scheduled   Contact information:   [address] [place] [id number] [phone]        Gabriela CrookHogan, Gabriela Donovan 01/14/2015, 3:55 AM

## 2015-01-14 NOTE — MAU Note (Signed)
C/O lower back pain and pelvic pain since Patient c/o shivers and and sweats, no fever Denies contractions Denies bright red vaginal bleeding.  Denies vaginal discharge Positive fetal movement.  Denies SROM/LOF  Denies any Complications of pregnancy

## 2015-01-14 NOTE — Discharge Instructions (Signed)
Constipation °Constipation is when a person has fewer than three bowel movements a week, has difficulty having a bowel movement, or has stools that are dry, hard, or larger than normal. As people grow older, constipation is more common. If you try to fix constipation with medicines that make you have a bowel movement (laxatives), the problem may get worse. Long-term laxative use may cause the muscles of the colon to become weak. A low-fiber diet, not taking in enough fluids, and taking certain medicines may make constipation worse.  °CAUSES  °· Certain medicines, such as antidepressants, pain medicine, iron supplements, antacids, and water pills.   °· Certain diseases, such as diabetes, irritable bowel syndrome (IBS), thyroid disease, or depression.   °· Not drinking enough water.   °· Not eating enough fiber-rich foods.   °· Stress or travel.   °· Lack of physical activity or exercise.   °· Ignoring the urge to have a bowel movement.   °· Using laxatives too much.   °SIGNS AND SYMPTOMS  °· Having fewer than three bowel movements a week.   °· Straining to have a bowel movement.   °· Having stools that are hard, dry, or larger than normal.   °· Feeling full or bloated.   °· Pain in the lower abdomen.   °· Not feeling relief after having a bowel movement.   °DIAGNOSIS  °Your health care provider will take a medical history and perform a physical exam. Further testing may be done for severe constipation. Some tests may include: °· A barium enema X-ray to examine your rectum, colon, and, sometimes, your small intestine.   °· A sigmoidoscopy to examine your lower colon.   °· A colonoscopy to examine your entire colon. °TREATMENT  °Treatment will depend on the severity of your constipation and what is causing it. Some dietary treatments include drinking more fluids and eating more fiber-rich foods. Lifestyle treatments may include regular exercise. If these diet and lifestyle recommendations do not help, your health care  provider may recommend taking over-the-counter laxative medicines to help you have bowel movements. Prescription medicines may be prescribed if over-the-counter medicines do not work.  °HOME CARE INSTRUCTIONS  °· Eat foods that have a lot of fiber, such as fruits, vegetables, whole grains, and beans. °· Limit foods high in fat and processed sugars, such as french fries, hamburgers, cookies, candies, and soda.   °· A fiber supplement may be added to your diet if you cannot get enough fiber from foods.   °· Drink enough fluids to keep your urine clear or pale yellow.   °· Exercise regularly or as directed by your health care provider.   °· Go to the restroom when you have the urge to go. Do not hold it.   °· Only take over-the-counter or prescription medicines as directed by your health care provider. Do not take other medicines for constipation without talking to your health care provider first.   °SEEK IMMEDIATE MEDICAL CARE IF:  °· You have bright red blood in your stool.   °· Your constipation lasts for more than 4 days or gets worse.   °· You have abdominal or rectal pain.   °· You have thin, pencil-like stools.   °· You have unexplained weight loss. °MAKE SURE YOU:  °· Understand these instructions. °· Will watch your condition. °· Will get help right away if you are not doing well or get worse. °Document Released: 06/25/2004 Document Revised: 10/02/2013 Document Reviewed: 07/09/2013 °Exit[employer]® Patient Information ©2015 [employer]. This information is not intended to replace advice given to you by your health care provider. Make sure you discuss any questions   you have with your health care provider.  Back Pain in Pregnancy Back pain during pregnancy is common. It happens in about half of all pregnancies. It is important for you and your baby that you remain active during your pregnancy.If you feel that back pain is not allowing you to remain active or sleep well, it is time to see your caregiver. Back  pain may be caused by several factors related to changes during your pregnancy.Fortunately, unless you had trouble with your back before your pregnancy, the pain is likely to get better after you deliver. Low back pain usually occurs between the fifth and seventh months of pregnancy. It can, however, happen in the first couple months. Factors that increase the risk of back problems include:   Previous back problems.  Injury to your back.  Having twins or multiple births.  A chronic cough.  Stress.  Job-related repetitive motions.  Muscle or spinal disease in the back.  Family history of back problems, ruptured (herniated) discs, or osteoporosis.  Depression, anxiety, and panic attacks. CAUSES   When you are pregnant, your body produces a hormone called relaxin. This hormonemakes the ligaments connecting the low back and pubic bones more flexible. This flexibility allows the baby to be delivered more easily. When your ligaments are loose, your muscles need to work harder to support your back. Soreness in your back can come from tired muscles. Soreness can also come from back tissues that are irritated since they are receiving less support.  As the baby grows, it puts pressure on the nerves and blood vessels in your pelvis. This can cause back pain.  As the baby grows and gets heavier during pregnancy, the uterus pushes the stomach muscles forward and changes your center of gravity. This makes your back muscles work harder to maintain good posture. SYMPTOMS  Lumbar pain during pregnancy Lumbar pain during pregnancy usually occurs at or above the waist in the center of the back. There may be pain and numbness that radiates into your leg or foot. This is similar to low back pain experienced by non-pregnant women. It usually increases with sitting for long periods of time, standing, or repetitive lifting. Tenderness may also be present in the muscles along your upper back. Posterior pelvic  pain during pregnancy Pain in the back of the pelvis is more common than lumbar pain in pregnancy. It is a deep pain felt in your side at the waistline, or across the tailbone (sacrum), or in both places. You may have pain on one or both sides. This pain can also go into the buttocks and backs of the upper thighs. Pubic and groin pain may also be present. The pain does not quickly resolve with rest, and morning stiffness may also be present. Pelvic pain during pregnancy can be brought on by most activities. A high level of fitness before and during pregnancy may or may not prevent this problem. Labor pain is usually 1 to 2 minutes apart, lasts for about 1 minute, and involves a bearing down feeling or pressure in your pelvis. However, if you are at term with the pregnancy, constant low back pain can be the beginning of early labor, and you should be aware of this. DIAGNOSIS  X-rays of the back should not be done during the first 12 to 14 weeks of the pregnancy and only when absolutely necessary during the rest of the pregnancy. MRIs do not give off radiation and are safe during pregnancy. MRIs also should only be  done when absolutely necessary. HOME CARE INSTRUCTIONS  Exercise as directed by your caregiver. Exercise is the most effective way to prevent or manage back pain. If you have a back problem, it is especially important to avoid sports that require sudden body movements. Swimming and walking are great activities.  Do not stand in one place for long periods of time.  Do not wear high heels.  Sit in chairs with good posture. Use a pillow on your lower back if necessary. Make sure your head rests over your shoulders and is not hanging forward.  Try sleeping on your side, preferably the left side, with a pillow or two between your legs. If you are sore after a night's rest, your bedmay betoo soft.Try placing a board between your mattress and box spring.  Listen to your body when lifting.If  you are experiencing pain, ask for help or try bending yourknees more so you can use your leg muscles rather than your back muscles. Squat down when picking up something from the floor. Do not bend over.  Eat a healthy diet. Try to gain weight within your caregiver's recommendations.  Use heat or cold packs 3 to 4 times a day for 15 minutes to help with the pain.  Only take over-the-counter or prescription medicines for pain, discomfort, or fever as directed by your caregiver. Sudden (acute) back pain  Use bed rest for only the most extreme, acute episodes of back pain. Prolonged bed rest over 48 hours will aggravate your condition.  Ice is very effective for acute conditions.  Put ice in a plastic bag.  Place a towel between your skin and the bag.  Leave the ice on for 10 to 20 minutes every 2 hours, or as needed.  Using heat packs for 30 minutes prior to activities is also helpful. Continued back pain See your caregiver if you have continued problems. Your caregiver can help or refer you for appropriate physical therapy. With conditioning, most back problems can be avoided. Sometimes, a more serious issue may be the cause of back pain. You should be seen right away if new problems seem to be developing. Your caregiver may recommend:  A maternity girdle.  An elastic sling.  A back brace.  A massage therapist or acupuncture. SEEK MEDICAL CARE IF:   You are not able to do most of your daily activities, even when taking the pain medicine you were given.  You need a referral to a physical therapist or chiropractor.  You want to try acupuncture. SEEK IMMEDIATE MEDICAL CARE IF:  You develop numbness, tingling, weakness, or problems with the use of your arms or legs.  You develop severe back pain that is no longer relieved with medicines.  You have a sudden change in bowel or bladder control.  You have increasing pain in other areas of the body.  You develop shortness of  breath, dizziness, or fainting.  You develop nausea, vomiting, or sweating.  You have back pain which is similar to labor pains.  You have back pain along with your water breaking or vaginal bleeding.  You have back pain or numbness that travels down your leg.  Your back pain developed after you fell.  You develop pain on one side of your back. You may have a kidney stone.  You see blood in your urine. You may have a bladder infection or kidney stone.  You have back pain with blisters. You may have shingles. Back pain is fairly common during pregnancy  but should not be accepted as just part of the process. Back pain should always be treated as soon as possible. This will make your pregnancy as pleasant as possible. Document Released: 01/05/2006 Document Revised: 12/20/2011 Document Reviewed: 02/16/2011 [facility] Patient Information 2015 [place]. This information is not intended to replace advice given to you by your health care provider. Make sure you discuss any questions you have with your health care provider.

## 2015-01-14 NOTE — MAU Note (Signed)
Pt states that she feels better since having a bowel movement.

## 2015-02-20 LAB — OB RESULTS CONSOLE GBS: GBS: NEGATIVE

## 2015-03-10 ENCOUNTER — Inpatient Hospital Stay (HOSPITAL_COMMUNITY)
Admission: AD | Admit: 2015-03-10 | Discharge: 2015-03-13 | DRG: 774 | Disposition: A | Discharge status: Home / Self Care | Payer: Medicaid Other | Source: Ambulatory Visit | Attending: Obstetrics and Gynecology | Admitting: Obstetrics and Gynecology

## 2015-03-10 ENCOUNTER — Encounter (HOSPITAL_COMMUNITY): Payer: Self-pay | Admitting: Anesthesiology

## 2015-03-10 ENCOUNTER — Encounter (HOSPITAL_COMMUNITY): Payer: Self-pay | Admitting: *Deleted

## 2015-03-10 DIAGNOSIS — O1092 Unspecified pre-existing hypertension complicating childbirth: Secondary | ICD-10-CM | POA: Diagnosis present

## 2015-03-10 DIAGNOSIS — I10 Essential (primary) hypertension: Secondary | ICD-10-CM

## 2015-03-10 DIAGNOSIS — Z9049 Acquired absence of other specified parts of digestive tract: Secondary | ICD-10-CM | POA: Diagnosis present

## 2015-03-10 DIAGNOSIS — O09523 Supervision of elderly multigravida, third trimester: Secondary | ICD-10-CM | POA: Diagnosis not present

## 2015-03-10 DIAGNOSIS — Z3480 Encounter for supervision of other normal pregnancy, unspecified trimester: Secondary | ICD-10-CM

## 2015-03-10 DIAGNOSIS — Z3A38 38 weeks gestation of pregnancy: Secondary | ICD-10-CM | POA: Diagnosis present

## 2015-03-10 DIAGNOSIS — O10913 Unspecified pre-existing hypertension complicating pregnancy, third trimester: Secondary | ICD-10-CM | POA: Diagnosis present

## 2015-03-10 DIAGNOSIS — O133 Gestational [pregnancy-induced] hypertension without significant proteinuria, third trimester: Secondary | ICD-10-CM

## 2015-03-10 HISTORY — DX: Gestational diabetes mellitus in pregnancy, unspecified control: O24.419

## 2015-03-10 HISTORY — DX: Essential (primary) hypertension: I10

## 2015-03-10 LAB — URINALYSIS, ROUTINE W REFLEX MICROSCOPIC
Bilirubin Urine: NEGATIVE
Glucose, UA: NEGATIVE mg/dL
Ketones, ur: NEGATIVE mg/dL
LEUKOCYTES UA: NEGATIVE
Nitrite: NEGATIVE
PH: 6 (ref 5.0–8.0)
PROTEIN: NEGATIVE mg/dL
Specific Gravity, Urine: 1.015 (ref 1.005–1.030)
Urobilinogen, UA: 0.2 mg/dL (ref 0.0–1.0)

## 2015-03-10 LAB — CBC
HEMATOCRIT: 32.4 % — AB (ref 36.0–46.0)
Hemoglobin: 11.1 g/dL — ABNORMAL LOW (ref 12.0–15.0)
MCH: 28 pg (ref 26.0–34.0)
MCHC: 34.3 g/dL (ref 30.0–36.0)
MCV: 81.8 fL (ref 78.0–100.0)
Platelets: 302 10*3/uL (ref 150–400)
RBC: 3.96 MIL/uL (ref 3.87–5.11)
RDW: 13.4 % (ref 11.5–15.5)
WBC: 16.6 10*3/uL — AB (ref 4.0–10.5)

## 2015-03-10 LAB — COMPREHENSIVE METABOLIC PANEL
ALT: 15 U/L (ref 14–54)
AST: 23 U/L (ref 15–41)
Albumin: 3 g/dL — ABNORMAL LOW (ref 3.5–5.0)
Alkaline Phosphatase: 129 U/L — ABNORMAL HIGH (ref 38–126)
Anion gap: 5 (ref 5–15)
BILIRUBIN TOTAL: 0.3 mg/dL (ref 0.3–1.2)
BUN: 15 mg/dL (ref 6–20)
CHLORIDE: 108 mmol/L (ref 101–111)
CO2: 23 mmol/L (ref 22–32)
CREATININE: 0.92 mg/dL (ref 0.44–1.00)
Calcium: 9.2 mg/dL (ref 8.9–10.3)
GFR calc Af Amer: >60 mL/min (ref >60)
GLUCOSE: 125 mg/dL — AB (ref 65–99)
Potassium: 4.4 mmol/L (ref 3.5–5.1)
SODIUM: 136 mmol/L (ref 135–145)
TOTAL PROTEIN: 6.5 g/dL (ref 6.5–8.1)

## 2015-03-10 LAB — URINE MICROSCOPIC-ADD ON

## 2015-03-10 LAB — TYPE AND SCREEN
ABO/RH(D): A POS
ANTIBODY SCREEN: NEGATIVE

## 2015-03-10 LAB — PROTEIN / CREATININE RATIO, URINE: CREATININE, URINE: 66 mg/dL

## 2015-03-10 LAB — URIC ACID: Uric Acid, Serum: 4.5 mg/dL (ref 2.3–6.6)

## 2015-03-10 LAB — LACTATE DEHYDROGENASE: LDH: 120 U/L (ref 98–192)

## 2015-03-10 MED ORDER — TERBUTALINE SULFATE 1 MG/ML IJ SOLN: 0.2500 mg | Freq: Once | INTRAMUSCULAR | Status: AC | PRN

## 2015-03-10 MED ORDER — FLEET ENEMA 7-19 GM/118ML RE ENEM: 1.0000 | ENEMA | RECTAL | Status: DC | PRN

## 2015-03-10 MED ORDER — OXYTOCIN 40 UNITS IN LACTATED RINGERS INFUSION - SIMPLE MED
62.5000 mL/h | INTRAVENOUS | Status: DC
  Filled 2015-03-10: qty 1000

## 2015-03-10 MED ORDER — LABETALOL HCL 200 MG PO TABS
200.0000 mg | ORAL_TABLET | Freq: Two times a day (BID) | ORAL | Status: DC
  Administered 2015-03-10 – 2015-03-11 (×2): 200 mg via ORAL
  Filled 2015-03-10 (×4): qty 1

## 2015-03-10 MED ORDER — OXYTOCIN BOLUS FROM INFUSION: 500.0000 mL | INTRAVENOUS | Status: DC

## 2015-03-10 MED ORDER — OXYCODONE-ACETAMINOPHEN 5-325 MG PO TABS: 1.0000 | ORAL_TABLET | ORAL | Status: DC | PRN

## 2015-03-10 MED ORDER — CITRIC ACID-SODIUM CITRATE 334-500 MG/5ML PO SOLN: 30.0000 mL | ORAL | Status: DC | PRN

## 2015-03-10 MED ORDER — LACTATED RINGERS IV SOLN
INTRAVENOUS | Status: DC
  Administered 2015-03-10 – 2015-03-11 (×3): via INTRAVENOUS

## 2015-03-10 MED ORDER — LIDOCAINE HCL (PF) 1 % IJ SOLN
30.0000 mL | INTRAMUSCULAR | Status: DC | PRN
  Filled 2015-03-10: qty 30

## 2015-03-10 MED ORDER — ONDANSETRON HCL 4 MG/2ML IJ SOLN: 4.0000 mg | Freq: Four times a day (QID) | INTRAMUSCULAR | Status: DC | PRN

## 2015-03-10 MED ORDER — ACETAMINOPHEN 325 MG PO TABS: 650.0000 mg | ORAL_TABLET | ORAL | Status: DC | PRN

## 2015-03-10 MED ORDER — LACTATED RINGERS IV SOLN
500.0000 mL | INTRAVENOUS | Status: DC | PRN
  Administered 2015-03-11 (×2): 500 mL via INTRAVENOUS

## 2015-03-10 MED ORDER — OXYCODONE-ACETAMINOPHEN 5-325 MG PO TABS: 2.0000 | ORAL_TABLET | ORAL | Status: DC | PRN

## 2015-03-10 MED ORDER — MISOPROSTOL 25 MCG QUARTER TABLET
25.0000 ug | ORAL_TABLET | ORAL | Status: DC | PRN
  Administered 2015-03-10 – 2015-03-11 (×2): 25 ug via VAGINAL
  Filled 2015-03-10 (×2): qty 0.25
  Filled 2015-03-10: qty 1

## 2015-03-10 MED ORDER — BUTORPHANOL TARTRATE 1 MG/ML IJ SOLN
1.0000 mg | INTRAMUSCULAR | Status: DC | PRN
  Administered 2015-03-11 (×3): 1 mg via INTRAVENOUS
  Filled 2015-03-10 (×3): qty 1

## 2015-03-10 NOTE — MAU Note (Signed)
C/o lower back pain and pelvic pain since last night; elevated BP today; already taking Labetalol; states that she is tired of being pregnant; ? chronic hypertension; hx of gestational diabetes and PIH  with previous pregnancy;

## 2015-03-10 NOTE — MAU Note (Signed)
Pt C/O severe back & pelvic pain since last night, constant.  Pt doesn't think the pain is contractions, denies bleeding or LOF.

## 2015-03-10 NOTE — MAU Provider Note (Signed)
History     CSN: [id number]  Arrival date and time: 03/10/15 1725   None     Chief Complaint  Patient presents with  . Back Pain  . Pelvic Pain   HPI   [name] is a 34 y.o. female G3P1011 at 16w0dwho presents with pelvic pain; she is here to be evaluated for labor. The pain is similar to vaginal pressure and the pain radiates to her lower back. The pain started last night; and continued throughout the night. Currently she rates her pain 9/10. She has tried tylenol for the pain and this did not help.   OB History    Gravida Para Term Preterm AB TAB SAB Ectopic Multiple Living   _0 Past Medical History  Diagnosis Date  . Hypertension   . Gestational diabetes     Past Surgical History  Procedure Laterality Date  . Cholecystectomy      Family History  Problem Relation Age of Onset  . Diabetes Mother   . Hypertension Mother   . Hypertension Father     History  Substance Use Topics  . Smoking status: Never Smoker   . Smokeless tobacco: Never Used  . Alcohol Use: No    Allergies: No Known Allergies  Prescriptions prior to admission  Medication Sig Dispense Refill Last Dose  . acetaminophen (TYLENOL) 500 MG tablet Take 500 mg by mouth every 6 (six) hours as needed for mild pain or headache.   03/10/2015 at Unknown time  . IRON, FERROUS GLUCONATE, PO Take 1 tablet by mouth every other day.   03/09/2015 at Unknown time  . labetalol (NORMODYNE) 200 MG tablet Take 200 mg by mouth 2 (two) times daily.   03/10/2015 at Unknown time  . Prenatal Vit-Fe Fumarate-FA (PRENATAL MULTIVITAMIN) TABS tablet Take 1 tablet by mouth daily at 12 noon.   03/10/2015 at Unknown time  . cyclobenzaprine (FLEXERIL) 10 MG tablet Take 1 tablet (10 mg total) by mouth 3 (three) times daily as needed for muscle spasms. (Patient not taking: Reported on 03/10/2015) 30 tablet 2 Not Taking at Unknown time   Results for orders placed or performed during the hospital encounter of  03/10/15 (from the past 48 hour(s))  Urinalysis, [facility] w reflex microscopic [facility]     Status: Abnormal   Collection Time: 03/10/15  5:40 PM  Result Value Ref Range   Color, Urine YELLOW YELLOW   APPearance CLEAR CLEAR   Specific Gravity, Urine 1.015 1.005 - 1.030   pH 6.0 5.0 - 8.0   Glucose, UA NEGATIVE NEGATIVE mg/dL   Hgb urine dipstick SMALL (A) NEGATIVE   Bilirubin Urine NEGATIVE NEGATIVE   Ketones, ur NEGATIVE NEGATIVE mg/dL   Protein, ur NEGATIVE NEGATIVE mg/dL   Urobilinogen, UA 0.2 0.0 - 1.0 mg/dL   Nitrite NEGATIVE NEGATIVE   Leukocytes, UA NEGATIVE NEGATIVE  Protein / creatinine ratio, urine     Status: None   Collection Time: 03/10/15  5:40 PM  Result Value Ref Range   Creatinine, Urine 66.00 mg/dL   Total Protein, Urine <6 mg/dL    Comment: REPEATED TO VERIFY NO NORMAL RANGE ESTABLISHED FOR THIS TEST    Protein Creatinine Ratio        0.00 - 0.15 mg/mg[Cre]    Comment: RESULT BELOW REPORTABLE RANGE, UNABLE TO CALCULATE.   Urine microscopic-add on     Status: Abnormal   Collection Time:  03/10/15  5:40 PM  Result Value Ref Range   Squamous Epithelial / LPF FEW (A) RARE   WBC, UA 0-2 <3 WBC/hpf   RBC / HPF 3-6 <3 RBC/hpf   Bacteria, UA RARE RARE  CBC     Status: Abnormal   Collection Time: 03/10/15  6:12 PM  Result Value Ref Range   WBC 16.6 (H) 4.0 - 10.5 K/uL   RBC 3.96 3.87 - 5.11 MIL/uL   Hemoglobin 11.1 (L) 12.0 - 15.0 g/dL   HCT 32.4 (L) 36.0 - 46.0 %   MCV 81.8 78.0 - 100.0 fL   MCH 28.0 26.0 - 34.0 pg   MCHC 34.3 30.0 - 36.0 g/dL   RDW 13.4 11.5 - 15.5 %   Platelets 302 150 - 400 K/uL  Comprehensive metabolic panel     Status: Abnormal   Collection Time: 03/10/15  6:12 PM  Result Value Ref Range   Sodium 136 135 - 145 mmol/L   Potassium 4.4 3.5 - 5.1 mmol/L   Chloride 108 101 - 111 mmol/L   CO2 23 22 - 32 mmol/L   Glucose, Bld 125 (H) 65 - 99 mg/dL   BUN 15 6 - 20 mg/dL   Creatinine, Ser 0.92 0.44 - 1.00 mg/dL   Calcium 9.2 8.9 -  10.3 mg/dL   Total Protein 6.5 6.5 - 8.1 g/dL   Albumin 3.0 (L) 3.5 - 5.0 g/dL   AST 23 15 - 41 U/L   ALT 15 14 - 54 U/L   Alkaline Phosphatase 129 (H) 38 - 126 U/L   Total Bilirubin 0.3 0.3 - 1.2 mg/dL   GFR calc non Af Amer >60 >60 mL/min   GFR calc Af Amer >60 >60 mL/min    Comment: (NOTE) The eGFR has been calculated using the CKD EPI equation. This calculation has not been validated in all clinical situations. eGFR's persistently <60 mL/min signify possible Chronic Kidney Disease.    Anion gap 5 5 - 15  Uric acid     Status: None   Collection Time: 03/10/15  6:12 PM  Result Value Ref Range   Uric Acid, Serum 4.5 2.3 - 6.6 mg/dL  Lactate dehydrogenase     Status: None   Collection Time: 03/10/15  6:12 PM  Result Value Ref Range   LDH 120 98 - 192 U/L    Review of Systems  Eyes: Negative for blurred vision.  Cardiovascular: Negative for leg swelling.  Gastrointestinal: Positive for abdominal pain (Denies upper abdominal pain ).  Genitourinary:       Denies leaking of fluid   Neurological: Positive for headaches (None now ).   Physical Exam   Blood pressure 165/92, pulse 83, temperature 98.4 F (36.9 C), temperature source Oral, resp. rate 20, last menstrual period 06/17/2014.  Physical Exam  Constitutional: She is oriented to person, place, and time. She appears well-developed and well-nourished. No distress.  HENT:  Head: Normocephalic.  Eyes: Pupils are equal, round, and reactive to light.  Neck: Neck supple.  Respiratory: Effort normal.  GI: Soft. There is no tenderness.  Genitourinary:  Dilation: 1 Effacement (%): 50 Station: -3 Exam by:: Devoria Glassing RN  Musculoskeletal: Normal range of motion. She exhibits no edema.  Negative clonus   Neurological: She is alert and oriented to person, place, and time. She has normal reflexes. She displays normal reflexes.  Skin: Skin is warm. She is not diaphoretic.  Psychiatric: Her behavior is normal.    Fetal  Tracing: Baseline:  130 bpm  Variability: Moderate  Accelerations: 15x15 Decelerations: None Toco: quiet   MAU Course  Procedures  None  MDM Discussed BP readings and labs with Dr. Carlis Abbott  Assessment and Plan   A: 1. Pregnancy induced hypertension, third trimester VS. Chronic hypertension    P:  Admit to labor and delivery for induction per Dr. Carlis Abbott.   Lezlie Lye, NP 03/10/2015 6:42 PM

## 2015-03-10 NOTE — MAU Note (Signed)
Dr. Chestine Sporelark admitting patient for induction and speaking with patient on the phone.

## 2015-03-10 NOTE — MAU Note (Signed)
Pt's BP elevated in triage, hs of gestational HTN with first pregnancy, is on labetalol currently.

## 2015-03-10 NOTE — Plan of Care (Signed)
Problem: Consults Goal: Birthing Suites Patient Information Press F2 to bring up selections list Outcome: Completed/Met Date Met:  03/10/15  Pt 37-[redacted] weeks EGA, Inpatient induction and PIH (Pregnancy induced hypertension)

## 2015-03-10 NOTE — H&P (Signed)
34 y.o. G3P1011 @ 1217w0d presents with complaints of pelvic pain and pressure.  She has a h/o CHTN on labetalol 200 mg PO bid.  On arrival to [facility], she was noted to have severe range BPs 180-190/100.  They stabilized at 140-150/80-90s without IV meds.  BPs in the office have been 110-120/70s.  She c/o HA today that has resolved.  Otherwise no BV/RUQ pain.  Otherwise has good fetal movement and no bleeding.  Past Medical History  Diagnosis Date  . Hypertension     Past Surgical History  Procedure Laterality Date  . Cholecystectomy      OB History  Gravida Para Term Preterm AB SAB TAB Ectopic Multiple Living  3 1 1  1 1    2     # Outcome Date GA Lbr Len/2nd Weight Sex Delivery Anes PTL Lv  3 Current           2 Term 01/2009 3417w0d  2.58 kg (5 lb 11 oz) F Vag-Spont  N Y  1 SAB               History   Social History  . Marital Status: Significant Other    Spouse Name: N/A  . Number of Children: N/A  . Years of Education: N/A   Occupational History  . Not on file.   Social History Main Topics  . Smoking status: Never Smoker   . Smokeless tobacco: Never Used  . Alcohol Use: No  . Drug Use: No  . Sexual Activity: Yes    Birth Control/ Protection: None   Other Topics Concern  . Not on file   Social History Narrative   Review of patient's allergies indicates no known allergies.    Prenatal Transfer Tool  Maternal Diabetes: No Genetic Screening: Normal Maternal Ultrasounds/Referrals: Normal Fetal Ultrasounds or other Referrals:  US 5/12: EFW 4lb 15oz Maternal Substance Abuse:  No Significant Maternal Medications:  Meds include: Other:  labetalol Significant Maternal Lab Results: None  ABO, Rh: --/--/A POS (05/30 1930) Antibody: negative Rubella:  Immune RPR: Nonreactive (11/23 0000)  HBsAg: Negative (11/23 0000)  HIV: Non-reactive (11/23 0000)  GBS: Negative (05/12 0000)    Other PNC:  1. AMA--neg NIPT.  BP 160/106 mmHg  Pulse 82  Temp(Src) 98.8 F (37.1 C)  (Oral)  Resp 16  Ht 5\' 4"  (1.626 m)  Wt 103.874 kg (229 lb)  BMI 39.29 kg/m2  LMP 06/17/2014 (Exact Date)    Per J Rasch, NP General:  NAD Abdomen:  soft, gravid, EFW 5.5# Ex:  2+ DTRs, neg clonus SVE:  1/50/-3 FHTs:  150s, mod var, + accels, no decels Toco:  quiet   A/P   34 y.o. 5317w0d  G3P1011 presents acute worsening of CHTN at term Admit for IOL IOL--cervical ripening with cytotec.  Epidural upon maternal request CHTN: cont labetalol BID, cbc/cmp wnl.  Will monitor BPs, if persistently severe will treat with IV anti-hypertensives for goal <160/110.  No neurologic sx.  Magnesium not indicated at this time, but will initiate if persistently severe BPs.  FSR/ GBS neg  Newt Levingston GEFFEL [employer]

## 2015-03-11 ENCOUNTER — Inpatient Hospital Stay (HOSPITAL_COMMUNITY): Payer: Medicaid Other | Admitting: Anesthesiology

## 2015-03-11 ENCOUNTER — Encounter (HOSPITAL_COMMUNITY): Payer: Self-pay | Admitting: Anesthesiology

## 2015-03-11 DIAGNOSIS — Z3480 Encounter for supervision of other normal pregnancy, unspecified trimester: Secondary | ICD-10-CM

## 2015-03-11 LAB — CBC
HCT: 31.7 % — ABNORMAL LOW (ref 36.0–46.0)
Hemoglobin: 10.6 g/dL — ABNORMAL LOW (ref 12.0–15.0)
MCH: 27.2 pg (ref 26.0–34.0)
MCHC: 33.4 g/dL (ref 30.0–36.0)
MCV: 81.5 fL (ref 78.0–100.0)
PLATELETS: 282 10*3/uL (ref 150–400)
RBC: 3.89 MIL/uL (ref 3.87–5.11)
RDW: 13.5 % (ref 11.5–15.5)
WBC: 16.1 10*3/uL — AB (ref 4.0–10.5)

## 2015-03-11 LAB — ABO/RH: ABO/RH(D): A POS

## 2015-03-11 MED ORDER — DIPHENHYDRAMINE HCL 50 MG/ML IJ SOLN: 12.5000 mg | INTRAMUSCULAR | Status: DC | PRN

## 2015-03-11 MED ORDER — SENNOSIDES-DOCUSATE SODIUM 8.6-50 MG PO TABS
2.0000 | ORAL_TABLET | ORAL | Status: DC
  Administered 2015-03-11 – 2015-03-12 (×2): 2 via ORAL
  Filled 2015-03-11 (×2): qty 2

## 2015-03-11 MED ORDER — SIMETHICONE 80 MG PO CHEW: 80.0000 mg | CHEWABLE_TABLET | ORAL | Status: DC | PRN

## 2015-03-11 MED ORDER — BENZOCAINE-MENTHOL 20-0.5 % EX AERO
1.0000 "application " | INHALATION_SPRAY | CUTANEOUS | Status: DC | PRN
  Administered 2015-03-11: 1 via TOPICAL
  Filled 2015-03-11: qty 56

## 2015-03-11 MED ORDER — IBUPROFEN 600 MG PO TABS
600.0000 mg | ORAL_TABLET | Freq: Four times a day (QID) | ORAL | Status: DC
  Administered 2015-03-11 – 2015-03-13 (×7): 600 mg via ORAL
  Filled 2015-03-11 (×7): qty 1

## 2015-03-11 MED ORDER — OXYCODONE-ACETAMINOPHEN 5-325 MG PO TABS
1.0000 | ORAL_TABLET | ORAL | Status: DC | PRN
  Administered 2015-03-12: 1 via ORAL
  Filled 2015-03-11: qty 1

## 2015-03-11 MED ORDER — WITCH HAZEL-GLYCERIN EX PADS: 1.0000 "application " | MEDICATED_PAD | CUTANEOUS | Status: DC | PRN

## 2015-03-11 MED ORDER — FENTANYL 2.5 MCG/ML BUPIVACAINE 1/10 % EPIDURAL INFUSION (WH - ANES): 14.0000 mL/h | INTRAMUSCULAR | Status: DC | PRN

## 2015-03-11 MED ORDER — PHENYLEPHRINE 40 MCG/ML (10ML) SYRINGE FOR IV PUSH (FOR BLOOD PRESSURE SUPPORT)
80.0000 ug | PREFILLED_SYRINGE | INTRAVENOUS | Status: DC | PRN
  Filled 2015-03-11: qty 20
  Filled 2015-03-11: qty 2

## 2015-03-11 MED ORDER — LIDOCAINE HCL (PF) 1 % IJ SOLN
INTRAMUSCULAR | Status: DC | PRN
  Administered 2015-03-11 (×2): 4 mL

## 2015-03-11 MED ORDER — ZOLPIDEM TARTRATE 5 MG PO TABS
5.0000 mg | ORAL_TABLET | Freq: Every evening | ORAL | Status: DC | PRN
Start: 2015-03-11 — End: 2015-03-13

## 2015-03-11 MED ORDER — ONDANSETRON HCL 4 MG/2ML IJ SOLN: 4.0000 mg | INTRAMUSCULAR | Status: DC | PRN

## 2015-03-11 MED ORDER — FENTANYL 2.5 MCG/ML BUPIVACAINE 1/10 % EPIDURAL INFUSION (WH - ANES)
14.0000 mL/h | INTRAMUSCULAR | Status: DC | PRN
  Administered 2015-03-11: 14 mL/h via EPIDURAL
  Filled 2015-03-11: qty 125

## 2015-03-11 MED ORDER — PHENYLEPHRINE 40 MCG/ML (10ML) SYRINGE FOR IV PUSH (FOR BLOOD PRESSURE SUPPORT): 80.0000 ug | PREFILLED_SYRINGE | INTRAVENOUS | Status: DC | PRN

## 2015-03-11 MED ORDER — ONDANSETRON HCL 4 MG PO TABS: 4.0000 mg | ORAL_TABLET | ORAL | Status: DC | PRN

## 2015-03-11 MED ORDER — DIBUCAINE 1 % RE OINT: 1.0000 "application " | TOPICAL_OINTMENT | RECTAL | Status: DC | PRN

## 2015-03-11 MED ORDER — EPHEDRINE 5 MG/ML INJ
10.0000 mg | INTRAVENOUS | Status: DC | PRN
  Filled 2015-03-11: qty 2

## 2015-03-11 MED ORDER — TETANUS-DIPHTH-ACELL PERTUSSIS 5-2.5-18.5 LF-MCG/0.5 IM SUSP: 0.5000 mL | Freq: Once | INTRAMUSCULAR | Status: DC

## 2015-03-11 MED ORDER — TERBUTALINE SULFATE 1 MG/ML IJ SOLN
0.2500 mg | Freq: Once | INTRAMUSCULAR | Status: DC | PRN
  Filled 2015-03-11: qty 1

## 2015-03-11 MED ORDER — OXYCODONE-ACETAMINOPHEN 5-325 MG PO TABS: 2.0000 | ORAL_TABLET | ORAL | Status: DC | PRN

## 2015-03-11 MED ORDER — MEASLES, MUMPS & RUBELLA VAC ~~LOC~~ INJ: 0.5000 mL | INJECTION | Freq: Once | SUBCUTANEOUS | Status: DC

## 2015-03-11 MED ORDER — OXYTOCIN 40 UNITS IN LACTATED RINGERS INFUSION - SIMPLE MED
1.0000 m[IU]/min | INTRAVENOUS | Status: DC
Start: 2015-03-11 — End: 2015-03-11
  Administered 2015-03-11: 2 m[IU]/min via INTRAVENOUS

## 2015-03-11 MED ORDER — ACETAMINOPHEN 325 MG PO TABS: 650.0000 mg | ORAL_TABLET | ORAL | Status: DC | PRN

## 2015-03-11 NOTE — Progress Notes (Signed)
Called for precipitous delivery.  Had rapid progression from 1cm to complete.    Delivery Note At 11:06 AM a viable female was delivered via Vaginal, Spontaneous Delivery (Presentation: cephalic, LOA).  Cord clamped and cut.  Dr Dareen PianoAnderson arrived and assumed care.  Mom and baby were stable and doing couplet care/skin to skin upon my departure.    Gabriela [name] J [name], DO 03/11/2015, 11:13 AM

## 2015-03-11 NOTE — Anesthesia Procedure Notes (Signed)
Epidural Patient location during procedure: OB Start time: 03/11/2015 10:05 AM  Staffing Anesthesiologist: Mal AmabileFOSTER, Lonnie Reth Performed by: anesthesiologist   Preanesthetic Checklist Completed: patient identified, site marked, surgical consent, pre-op evaluation, timeout performed, IV checked, risks and benefits discussed and monitors and equipment checked  Epidural Patient position: sitting Prep: site prepped and draped and DuraPrep Patient monitoring: continuous pulse ox and blood pressure Approach: midline Location: L3-L4 Injection technique: LOR air  Needle:  Needle type: Tuohy  Needle gauge: 17 G Needle length: 9 cm and 9 Needle insertion depth: 7 cm Catheter type: closed end flexible Catheter size: 19 Gauge Catheter at skin depth: 12 cm Test dose: negative and Other  Assessment Events: blood not aspirated, injection not painful, no injection resistance, negative IV test and no paresthesia  Additional Notes Patient identified. Risks and benefits discussed including failed block, incomplete  Pain control, post dural puncture headache, nerve damage, paralysis, blood pressure Changes, nausea, vomiting, reactions to medications-both toxic and allergic and post Partum back pain. All questions were answered. Patient expressed understanding and wished to proceed. Sterile technique was used throughout procedure. Epidural site was Dressed with sterile barrier dressing. No paresthesias, signs of intravascular injection Or signs of intrathecal spread were encountered.  Patient was more comfortable after the epidural was dosed. Please see RN's note for documentation of vital signs and FHR which are stable.

## 2015-03-11 NOTE — Anesthesia Preprocedure Evaluation (Signed)
Anesthesia Evaluation  Patient identified by MRN, date of birth, ID band Patient awake    Reviewed: Allergy & Precautions, Patient's Chart, lab work & pertinent test results, reviewed documented beta blocker date and time   Airway Mallampati: III  TM Distance: >3 FB Neck ROM: Full    Dental no notable dental hx. (+) Teeth Intact   Pulmonary neg pulmonary ROS,  breath sounds clear to auscultation  Pulmonary exam normal       Cardiovascular hypertension, Pt. on medications and Pt. on home beta blockers Normal cardiovascular examRhythm:Regular Rate:Normal     Neuro/Psych negative neurological ROS  negative psych ROS   GI/Hepatic GERD-  ,  Endo/Other  diabetes, Well Controlled, GestationalMorbid obesity  Renal/GU      Musculoskeletal negative musculoskeletal ROS (+)   Abdominal (+) + obese,   Peds  Hematology  (+) anemia ,   Anesthesia Other Findings   Reproductive/Obstetrics (+) Pregnancy                             Anesthesia Physical Anesthesia Plan  ASA: III  Anesthesia Plan: Epidural   Post-op Pain Management:    Induction:   Airway Management Planned: Natural Airway  Additional Equipment:   Intra-op Plan:   Post-operative Plan:   Informed Consent: I have reviewed the patients History and Physical, chart, labs and discussed the procedure including the risks, benefits and alternatives for the proposed anesthesia with the patient or authorized representative who has indicated his/her understanding and acceptance.     Plan Discussed with: Anesthesiologist  Anesthesia Plan Comments:         Anesthesia Quick Evaluation

## 2015-03-11 NOTE — Progress Notes (Signed)
Cx- 10/1/-3. AROM- clear

## 2015-03-12 LAB — RPR: RPR Ser Ql: NONREACTIVE

## 2015-03-12 LAB — CBC
HCT: 30.3 % — ABNORMAL LOW (ref 36.0–46.0)
Hemoglobin: 10 g/dL — ABNORMAL LOW (ref 12.0–15.0)
MCH: 26.9 pg (ref 26.0–34.0)
MCHC: 33 g/dL (ref 30.0–36.0)
MCV: 81.5 fL (ref 78.0–100.0)
PLATELETS: 271 10*3/uL (ref 150–400)
RBC: 3.72 MIL/uL — AB (ref 3.87–5.11)
RDW: 13.7 % (ref 11.5–15.5)
WBC: 16.6 10*3/uL — ABNORMAL HIGH (ref 4.0–10.5)

## 2015-03-12 NOTE — Discharge Summary (Signed)
Obstetric Discharge Summary Reason for Admission: induction of labor Prenatal Procedures: NST Intrapartum Procedures: spontaneous vaginal delivery Postpartum Procedures: none Complications-Operative and Postpartum: 2 degree perineal laceration HEMOGLOBIN  Date Value Ref Range Status  03/12/2015 10.0* 12.0 - 15.0 g/dL Final   HCT  Date Value Ref Range Status  03/12/2015 30.3* 36.0 - 46.0 % Final     Discharge Diagnoses: Term Pregnancy-delivered  Discharge Information: Date: 03/12/2015 Activity: pelvic rest Diet: routine Medications: Ibuprofen and Iron Condition: improved Instructions: refer to practice specific booklet Discharge to: home Follow-up Information    Follow up with Levi AlandANDERSON,MARK E, MD In 4 weeks.   Specialty:  Obstetrics and Gynecology   Contact information:   [address] [place] [id number] [phone]       Newborn Data: Live born female  Birth Weight: 6 lb 1 oz (2750 g) APGAR: 8, 9  Home with mother.  Gabriela Donovan 03/12/2015, 7:21 AM

## 2015-03-12 NOTE — Anesthesia Postprocedure Evaluation (Signed)
  Anesthesia Post-op Note  Patient: Gabriela Donovan  Procedure(s) Performed: * No procedures listed *  Patient Location: Mother/Baby  Anesthesia Type:Epidural  Level of Consciousness: awake, alert , oriented and patient cooperative  Airway and Oxygen Therapy: Patient Spontanous Breathing  Post-op Pain: mild  Post-op Assessment: Patient's Cardiovascular Status Stable, Respiratory Function Stable, No headache, No backache, No residual numbness and No residual motor weakness  Post-op Vital Signs: stable  Last Vitals:  Filed Vitals:   03/12/15 0645  BP: 132/76  Pulse: 74  Temp:   Resp: 18    Complications: No apparent anesthesia complications

## 2015-03-12 NOTE — Progress Notes (Signed)
Patient is eating, ambulating, voiding.  Pain control is good.  Filed Vitals:   03/11/15 1337 03/11/15 1454 03/11/15 1826 03/12/15 0624  BP: 135/80 138/80 136/74   Pulse: 86 79 76   Temp: 98.6 F (37 C) 98.4 F (36.9 C) 98.3 F (36.8 C) 98.4 F (36.9 C)  TempSrc: Axillary Oral Oral Oral  Resp: 18 18    Height:      Weight:      SpO2:        Fundus firm Perineum without swelling.  Lab Results  Component Value Date   WBC 16.6* 03/12/2015   HGB 10.0* 03/12/2015   HCT 30.3* 03/12/2015   MCV 81.5 03/12/2015   PLT 271 03/12/2015    --/--/A POS (05/30 2013)/RI  A/P Post partum day 1.  Routine care.  Expect d/c routine.    Hiba Garry A

## 2015-03-13 MED ORDER — OXYCODONE-ACETAMINOPHEN 5-325 MG PO TABS: 1.0000 | ORAL_TABLET | ORAL | Status: DC | PRN

## 2015-03-13 NOTE — Lactation Note (Signed)
This note was copied from the chart of Gabriela Donovan. Lactation Consultation Note: Mother states she is continuing  to latch infant using a #20  nipple shield. She states she sees drops of colostrum in the shield. Mother is supplementing with formula after each feeding . Infant took 30 ml of formula after last feeding. Reviewed application of the nipple shield. I applied the nipple shield and had mother to do return demonstration. I gave mother a #24 nipple shield. Advised to get proper latch with good depth. Assist mother with hand expression of colostrum. Observed good flow. Mother states she pumped 5 ml with last pumping.  Mother has a wide space between her breast and LGT. Mother has an electric pump at home. She was advised to continue to post pump 6-8 times daily after breastfeeding. Mother was offered a LC follow up visit. She states she has so many appointments at present, she will call later if breastfeeding not going well. Mother is aware of available LC services and WIC.  Patient Name: Gabriela Satira AnisSolange Donovan [id number]'s Date: 03/13/2015 Reason for consult: Follow-up assessment   Maternal Data    Feeding Nipple Type: Slow - flow  LATCH Score/Interventions                      Lactation Tools Discussed/Used     Consult Status Consult Status: Complete    Michel BickersKendrick, Virgel Haro McCoy 03/13/2015, 10:41 AM

## 2015-03-17 ENCOUNTER — Encounter (HOSPITAL_COMMUNITY): Payer: Self-pay | Admitting: *Deleted

## 2016-03-18 ENCOUNTER — Encounter: Payer: Self-pay | Admitting: Obstetrics and Gynecology

## 2016-03-18 ENCOUNTER — Ambulatory Visit (INDEPENDENT_AMBULATORY_CARE_PROVIDER_SITE_OTHER): Payer: Medicaid Other | Admitting: Obstetrics and Gynecology

## 2016-03-18 VITALS — BP 164/100 | HR 65 | Wt 185.6 lb

## 2016-03-18 DIAGNOSIS — I10 Essential (primary) hypertension: Secondary | ICD-10-CM | POA: Diagnosis not present

## 2016-03-18 DIAGNOSIS — O1002 Pre-existing essential hypertension complicating childbirth: Secondary | ICD-10-CM

## 2016-03-18 DIAGNOSIS — B3731 Acute candidiasis of vulva and vagina: Secondary | ICD-10-CM

## 2016-03-18 DIAGNOSIS — Z01419 Encounter for gynecological examination (general) (routine) without abnormal findings: Secondary | ICD-10-CM

## 2016-03-18 DIAGNOSIS — Z30011 Encounter for initial prescription of contraceptive pills: Secondary | ICD-10-CM | POA: Diagnosis not present

## 2016-03-18 DIAGNOSIS — B373 Candidiasis of vulva and vagina: Secondary | ICD-10-CM | POA: Diagnosis not present

## 2016-03-18 MED ORDER — NORETHINDRONE 0.35 MG PO TABS: 1.0000 | ORAL_TABLET | Freq: Every day | ORAL | Status: DC

## 2016-03-18 MED ORDER — FLUCONAZOLE 100 MG PO TABS: 200.0000 mg | ORAL_TABLET | Freq: Once | ORAL | Status: DC

## 2016-03-18 NOTE — Progress Notes (Signed)
Obstetrics and Gynecology Visit New Patient Evaluation  Appointment Date: 03/19/2016  Primary Care Provider: No primary care provider on file.  Referring Provider: Self  Chief Complaint:  Chief Complaint  Patient presents with  . Gynecologic Exam    History of Present Illness: 35 y/o G3P2012 with the here for her annual exam. PMHx negative.    She desires birth control options and also states she's had some decreased libido since the birth of her last child in 02/2015.  She was on the pill and has been on it for many years and has had no problems with it.  She is not currently on it and would like a refill.  She states that she has a period qmonth, light, not painful and no intermenstrual bleeding.  She does note some burning with urination and some vaginal discharge and irritation but no itching.    No fevers, chills, chest pain, SOB, headache nausea, vomiting, hematuria, diarrhea, urinary or fecal incontinence issues, breast s/s  +constipation, occasional lethargy and ?discomfort with intercourse vs decreased libidon   [facility] patient  Review of Systems: 12 point review of systems is negative or as noted in the History of Present Illness.  Patient Active Problem List   Diagnosis Date Noted  . Essential hypertension 03/10/2015    Past Medical History:  Past Medical History  Diagnosis Date  . Gestational diabetes   . Essential hypertension 03/10/2015    Past Surgical History:  Past Surgical History  Procedure Laterality Date  . Cholecystectomy      Past Obstetrical History:  OB History    Gravida Para Term Preterm AB TAB SAB Ectopic Multiple Living   3 2 2  1  1   0 2      SVD x 2  Past Gynecological History: As per HPI. No. history of abnormal pap smears No. history of STIs.    Social History:  Social History   Social History  . Marital Status: Significant Other    Spouse Name: N/A  . Number of Children: N/A  . Years of  Education: N/A   Occupational History  . Not on file.   Social History Main Topics  . Smoking status: Never Smoker   . Smokeless tobacco: Never Used  . Alcohol Use: No  . Drug Use: No  . Sexual Activity: Yes    Birth Control/ Protection: None   Other Topics Concern  . Not on file   Social History Narrative    Family History:  Family History  Problem Relation Age of Onset  . Diabetes Mother   . Hypertension Mother   . Hypertension Father    She denies any female cancers, bleeding or blood clotting disorders.   Medications Ms. Inoa does not currently have medications on file.  Allergies Review of patient's allergies indicates no known allergies.   Physical Exam:  BP 164/100 mmHg  Pulse 65  Wt 185 lb 9.6 oz (84.188 kg)  LMP 02/28/2016 (Approximate)  Breastfeeding? No Body mass index is 31.84 kg/(m^2). General appearance: Well nourished, well developed female in no acute distress.  Neck:  Supple, normal appearance, and no thyromegaly  Cardiovascular: normal s1 and s2.  No murmurs, rubs or gallops. Respiratory:  Clear to auscultation bilateral. Normal respiratory effort Abdomen: positive bowel sounds and no masses, hernias; diffusely non tender to palpation, non distended Breasts: breasts appear normal, no suspicious masses, no skin or nipple changes or axillary nodes and negative exam  on palpation. Neuro/Psych:  Normal mood and affect.  Skin:  Warm and dry.  Lymphatic:  No inguinal lymphadenopathy.   Pelvic exam: is not limited by body habitus EGBUS: within normal limits except for mild b/l erythema on the labia majora Vagina: within normal limits except mild white clumpy discharge in vault Cervix:  no lesions or cervical motion tenderness Uterus:  nonenlarged and approximately 8 week sized Adnexa:  normal adnexa and no mass, fullness, tenderness Rectovaginal: negative  Laboratory: none  Radiology: none  Assessment: +vulvovaginal candidiasis  Plan:  *Well  woman: normal exam except for yeast infection. Patient states diflucan worked in the past so this was prescribed; patient declines STI testing *BC and sexual activity: d/w her that given her age and HTN that I don't recommend any estrogen containing products. She states that she doesn't believe that she wants any more children but her husband does. I told her that I recommend trying lubricants but that this incongruity b/w them could be causing her to have decreased libido, as she states that they don't have any current life stressors such as money or jobs.  I recommended that she talk to him about this but in the meantime I recommended condoms since she's not sure if she wants to have another child. She states that she wants to be on something so instructions given about POP use and brochures for Mirena, Nexplanon given to her. *HTN: no s/s but recommended that she needs to fine a PCP and importance of this. Patient amenable to this and information for this given to her.    RTC 1 year and PRN  Cornelia Copa MD Attending Center for [employer] [facility])

## 2017-03-31 ENCOUNTER — Encounter: Payer: Self-pay | Admitting: Obstetrics and Gynecology

## 2017-03-31 ENCOUNTER — Ambulatory Visit (INDEPENDENT_AMBULATORY_CARE_PROVIDER_SITE_OTHER): Payer: Medicaid Other | Admitting: Obstetrics and Gynecology

## 2017-03-31 ENCOUNTER — Other Ambulatory Visit (HOSPITAL_COMMUNITY)
Admission: RE | Admit: 2017-03-31 | Discharge: 2017-03-31 | Disposition: A | Discharge status: Home / Self Care | Payer: Medicaid Other | Source: Ambulatory Visit | Attending: Obstetrics and Gynecology | Admitting: Obstetrics and Gynecology

## 2017-03-31 VITALS — BP 159/83 | HR 75 | Ht 64.0 in | Wt 178.2 lb

## 2017-03-31 DIAGNOSIS — Z30017 Encounter for initial prescription of implantable subdermal contraceptive: Secondary | ICD-10-CM | POA: Diagnosis not present

## 2017-03-31 DIAGNOSIS — N939 Abnormal uterine and vaginal bleeding, unspecified: Secondary | ICD-10-CM

## 2017-03-31 DIAGNOSIS — Z3041 Encounter for surveillance of contraceptive pills: Secondary | ICD-10-CM

## 2017-03-31 DIAGNOSIS — Z01419 Encounter for gynecological examination (general) (routine) without abnormal findings: Secondary | ICD-10-CM | POA: Insufficient documentation

## 2017-03-31 DIAGNOSIS — Z308 Encounter for other contraceptive management: Secondary | ICD-10-CM | POA: Diagnosis present

## 2017-03-31 MED ORDER — ETONOGESTREL 68 MG ~~LOC~~ IMPL
68.0000 mg | DRUG_IMPLANT | Freq: Once | SUBCUTANEOUS | Status: AC
  Administered 2017-03-31: 68 mg via SUBCUTANEOUS

## 2017-03-31 NOTE — Progress Notes (Signed)
Obstetrics and Gynecology Annual Patient Evaluation  Appointment Date: 03/31/2017  OBGYN Clinic: [facility]  Primary Care Provider: No primary care provider on file.  Referring Provider: No ref. provider found  Chief Complaint:  Chief Complaint  Patient presents with  . Gynecologic Exam    History of Present Illness: Gabriela Donovan is a 36 y.o. Hispanic [id number] (Patient's last menstrual period was 03/21/2017 (approximate).), seen for the above chief complaint. Her past medical history is significant for HTN   Only issue is that she has occasional AUB on the Camila.   No breast s/s, fevers, chills, chest pain, SOB, nausea, vomiting, abdominal pain, dysuria, hematuria, vaginal itching, dyspareunia, SUI or OAB, diarrhea, constipation, blood in BMs  Review of Systems: as noted in the History of Present Illness.  Patient Active Problem List   Diagnosis Date Noted  . Essential hypertension 03/10/2015    Past Medical History:  Past Medical History:  Diagnosis Date  . Essential hypertension 03/10/2015  . Gestational diabetes     Past Surgical History:  Past Surgical History:  Procedure Laterality Date  . CHOLECYSTECTOMY      Past Obstetrical History:  OB History  Gravida Para Term Preterm AB Living  3 2 2   1 2   SAB TAB Ectopic Multiple Live Births  1     0 2    # Outcome Date GA Lbr Len/2nd Weight Sex Delivery Anes PTL Lv  3 Term 03/11/15 [redacted]w[redacted]d 01:53 / 00:07 6 lb 1 oz (2.75 kg) M Vag-Spont EPI  LIV     Birth Comments: No anomalies noted  2 Term 01/2009 [redacted]w[redacted]d  5 lb 11 oz (2.58 kg) F Vag-Spont  N LIV  1 SAB               Past Gynecological History: As per HPI. No. history of abnormal pap smears (last pap smear: ?2016, which was NILM) No. history of STIs.    Social History:  Social History   Social History  . Marital status: Significant Other    Spouse name: N/A  . Number of children: N/A  . Years of education: N/A   Occupational History   . Not on file.   Social History Main Topics  . Smoking status: Never Smoker  . Smokeless tobacco: Never Used  . Alcohol use No  . Drug use: No  . Sexual activity: Yes    Birth control/ protection: None   Other Topics Concern  . Not on file   Social History Narrative  . No narrative on file    Family History:  Family History  Problem Relation Age of Onset  . Diabetes Mother   . Hypertension Mother   . Hypertension Father    She denies any female cancers   Medications Ms. Donovan had no medications administered during this visit. Current Outpatient Prescriptions  Medication Sig Dispense Refill  . norethindrone (MICRONOR,CAMILA,ERRIN) 0.35 MG tablet Take 1 tablet by mouth daily.    Marland Kitchen acetaminophen (TYLENOL) 500 MG tablet Take 500 mg by mouth every 6 (six) hours as needed for mild pain or headache. Reported on 03/18/2016     No current facility-administered medications for this visit.     Allergies Patient has no known allergies.   Physical Exam:  BP (!) 159/83   Pulse 75   Ht 5\' 4"  (1.626 m)   Wt 178 lb 3.2 oz (80.8 kg)   LMP 03/21/2017 (Approximate)   BMI 30.59 kg/m  Body mass index is  30.59 kg/m. General appearance: Well nourished, well developed female in no acute distress.  Neck:  Supple, normal appearance, and no thyromegaly  Cardiovascular: normal s1 and s2.  No murmurs, rubs or gallops. Respiratory:  Clear to auscultation bilateral. Normal respiratory effort Abdomen: positive bowel sounds and no masses, hernias; diffusely non tender to palpation, non distended Breasts: breasts appear normal, no suspicious masses, no skin or nipple changes or axillary nodes. Neuro/Psych:  Normal mood and affect.  Skin:  Warm and dry.  Lymphatic:  No inguinal lymphadenopathy.   Pelvic exam: is not limited by body habitus EGBUS: within normal limits, Vagina: within normal limits and with no blood or discharge in the vault, Cervix: normal appearing cervix without tenderness,  discharge or lesions. Uterus:  nonenlarged and non tender and Adnexa:  normal adnexa and no mass, fullness, tenderness Rectovaginal: deferred  Laboratory: UPT neg  Radiology: none  Assessment: pt doing well  Plan:  1. Abnormal uterine bleeding (AUB) Likely due to progestin only pill. Options d/w her and she'd like to Nexplanon, which was inserted w/o difficulty today. Pt told that can have some AUB for a few months as body adjusts to new method.  - TSH - Basic metabolic panel - CBC - Beta HCG, Quant  2. Encounter for gynecological examination (general) (routine) without abnormal findings Resources for a PCP given to patient - Cytology - PAP  3. Encounter for surveillance of contraceptive pills See above  Orders Placed This Encounter  Procedures  . TSH  . Basic metabolic panel  . CBC  . Beta HCG, Quant    RTC PRN  Cornelia Copaharlie Jeffrey Graefe, Jr MD Attending Center for [facility](Faculty Practice)

## 2017-03-31 NOTE — Procedures (Signed)
Nexplanon Insertion Procedure Note Prior to the procedure being performed, the patient (or guardian) was asked to state their full name, date of birth, type of procedure being performed and the exact location of the operative site. This information was then checked against the documentation in the patient's chart. Prior to the procedure being performed, a "time out" was performed by the physician that confirmed the correct patient, procedure and site. UPT negative  After informed consent was obtained, the patient's non-dominant left arm was chosen for insertion. A site was marked approximately 8 cm proximal to the medial epicondyle in the sulcus between the biceps and triceps on the inner surface. The area was cleaned with alcohol then local anesthesia was infiltrated with 3 ml of 1% lidocaine along the planned insertion track. The area was prepped with betadine. Using sterile technique the Nexplanon device was inserted per manufacturer's guidelines in the subdermal connective tissue using the standard insertion technique without difficulty. Pressure was applied and the insertion site was hemostatic. The presence of the Nexplanon was confirmed immediately after insertion by palpation by both me and the patient and by checking the tip of needle for the absence of the insert.  A pressure dressing was applied.  The patient tolerated the procedure well.  She was told to overlap the pills for a week before considering the nexplanon effective.   Cornelia Copaharlie Brooklee Michelin, Jr MD Attending Center for [facility](Faculty Practice)

## 2017-04-01 LAB — CBC
HEMOGLOBIN: 12.5 g/dL (ref 11.1–15.9)
Hematocrit: 38.1 % (ref 34.0–46.6)
MCH: 27.5 pg (ref 26.6–33.0)
MCHC: 32.8 g/dL (ref 31.5–35.7)
MCV: 84 fL (ref 79–97)
Platelets: 291 10*3/uL (ref 150–379)
RBC: 4.54 x10E6/uL (ref 3.77–5.28)
RDW: 13.7 % (ref 12.3–15.4)
WBC: 7.6 10*3/uL (ref 3.4–10.8)

## 2017-04-01 LAB — BASIC METABOLIC PANEL
BUN/Creatinine Ratio: 18 (ref 9–23)
BUN: 13 mg/dL (ref 6–20)
CALCIUM: 9.1 mg/dL (ref 8.7–10.2)
CO2: 25 mmol/L (ref 20–29)
Chloride: 102 mmol/L (ref 96–106)
Creatinine, Ser: 0.72 mg/dL (ref 0.57–1.00)
GFR, EST AFRICAN AMERICAN: 124 mL/min/{1.73_m2} (ref >59)
GFR, EST NON AFRICAN AMERICAN: 107 mL/min/{1.73_m2} (ref >59)
Glucose: 67 mg/dL (ref 65–99)
Potassium: 4 mmol/L (ref 3.5–5.2)
Sodium: 140 mmol/L (ref 134–144)

## 2017-04-01 LAB — TSH: TSH: 1.93 u[IU]/mL (ref 0.450–4.500)

## 2017-04-01 LAB — BETA HCG QUANT (REF LAB): hCG Quant: <1 m[IU]/mL

## 2017-04-04 LAB — CYTOLOGY - PAP
CHLAMYDIA, DNA PROBE: NEGATIVE
Diagnosis: NEGATIVE
HPV: NOT DETECTED
NEISSERIA GONORRHEA: NEGATIVE
Trichomonas: NEGATIVE

## 2017-04-04 LAB — POCT PREGNANCY, URINE: Preg Test, Ur: NEGATIVE

## 2018-01-20 ENCOUNTER — Ambulatory Visit (INDEPENDENT_AMBULATORY_CARE_PROVIDER_SITE_OTHER): Payer: Self-pay | Admitting: General Practice

## 2018-01-20 ENCOUNTER — Other Ambulatory Visit (HOSPITAL_COMMUNITY)
Admission: RE | Admit: 2018-01-20 | Discharge: 2018-01-20 | Disposition: A | Discharge status: Home / Self Care | Payer: Medicaid Other | Source: Ambulatory Visit | Attending: Obstetrics & Gynecology | Admitting: Obstetrics & Gynecology

## 2018-01-20 DIAGNOSIS — N898 Other specified noninflammatory disorders of vagina: Secondary | ICD-10-CM

## 2018-01-20 MED ORDER — FLUCONAZOLE 150 MG PO TABS: 150.0000 mg | ORAL_TABLET | Freq: Once | ORAL | 0 refills | Status: DC

## 2018-01-20 NOTE — Progress Notes (Signed)
Patient reports thick white clumpy discharge for past 2 weeks with itching/irritation/burning. Patient states sometimes it goes away but it comes right back. Patient reports trying monistat but has not had relief. Wet prep collected. Diflucan sent in per protocol. Informed patient we will contact her if anything comes back on test other than yeast. Patient verbalized understanding & had no questions

## 2018-01-23 ENCOUNTER — Telehealth: Payer: Self-pay | Admitting: Obstetrics & Gynecology

## 2018-01-23 LAB — CERVICOVAGINAL ANCILLARY ONLY
Bacterial vaginitis: NEGATIVE
Candida vaginitis: NEGATIVE
TRICH (WINDOWPATH): NEGATIVE

## 2018-01-23 NOTE — Telephone Encounter (Signed)
Requesting a call from nurse

## 2018-01-24 ENCOUNTER — Telehealth: Payer: Self-pay | Admitting: *Deleted

## 2018-01-24 ENCOUNTER — Other Ambulatory Visit: Payer: Self-pay | Admitting: Obstetrics & Gynecology

## 2018-01-24 DIAGNOSIS — B3731 Acute candidiasis of vulva and vagina: Secondary | ICD-10-CM

## 2018-01-24 DIAGNOSIS — B373 Candidiasis of vulva and vagina: Secondary | ICD-10-CM

## 2018-01-24 DIAGNOSIS — N898 Other specified noninflammatory disorders of vagina: Secondary | ICD-10-CM

## 2018-01-24 MED ORDER — FLUCONAZOLE 150 MG PO TABS: 150.0000 mg | ORAL_TABLET | Freq: Once | ORAL | 0 refills | Status: AC

## 2018-01-24 NOTE — Telephone Encounter (Signed)
Patient called front office and was transferred to me. She was in clinic on Friday for self swab and was given diflucan x1 per protocol. Stated that Saterday it seemed to be helping, but Sunday she was very uncomfortable, and Monday it was even worse. Checking wet prep, results were all negative. Spoke with Dr Nira Retortegele. She advised one more dose of diflucan but if the infection does not clear the patient needs to see a provider. I relayed this to the patient who voiced understanding. Verified pt pharmacy and sent in medication.

## 2018-04-05 ENCOUNTER — Ambulatory Visit: Payer: Medicaid Other | Admitting: Obstetrics and Gynecology

## 2018-07-19 ENCOUNTER — Encounter: Payer: Self-pay | Admitting: Obstetrics and Gynecology

## 2018-07-19 ENCOUNTER — Ambulatory Visit (INDEPENDENT_AMBULATORY_CARE_PROVIDER_SITE_OTHER): Payer: Medicaid Other | Admitting: Obstetrics and Gynecology

## 2018-07-19 VITALS — BP 154/80 | HR 62 | Wt 174.0 lb

## 2018-07-19 DIAGNOSIS — N921 Excessive and frequent menstruation with irregular cycle: Secondary | ICD-10-CM

## 2018-07-19 DIAGNOSIS — Z975 Presence of (intrauterine) contraceptive device: Secondary | ICD-10-CM

## 2018-07-19 DIAGNOSIS — Z0000 Encounter for general adult medical examination without abnormal findings: Secondary | ICD-10-CM | POA: Diagnosis present

## 2018-07-19 DIAGNOSIS — Z01419 Encounter for gynecological examination (general) (routine) without abnormal findings: Secondary | ICD-10-CM

## 2018-07-19 DIAGNOSIS — Z3046 Encounter for surveillance of implantable subdermal contraceptive: Secondary | ICD-10-CM

## 2018-07-19 MED ORDER — NORETHINDRONE 0.35 MG PO TABS: ORAL_TABLET | ORAL | 3 refills | Status: DC

## 2018-07-19 NOTE — Patient Instructions (Signed)
Ligadura laparoscópica de trompas  (Laparoscopic Tubal Ligation)  La ligadura laparoscópica de trompas es un procedimiento para cerrar las trompas de Falopio. Esto se realiza con el fin de evitar el embarazo. Cuando las trompas de Falopio se cierran, los óvulos que liberan los ovarios no pueden ingresar al útero, y los espermatozoides no pueden llegar a los óvulos.  La ligadura laparoscópica de trompas a veces se conoce como “atadura de trompas”. No debe realizarse este procedimiento si quiere quedar embarazada alguna vez o si no está segura de si desea tener más hijos.  INFORME A SU MÉDICO:  · Cualquier alergia que tenga.  · Todos los medicamentos que utiliza, incluidos vitaminas, hierbas, gotas oftálmicas, cremas y medicamentos de venta libre.  · Problemas previos que usted o los miembros de su familia hayan tenido con el uso de anestésicos.  · Enfermedades de la sangre que tenga.  · Si tiene cirugías previas.  · Cualquier enfermedad que tenga.  · Si está embarazada o podría estarlo.  · Cualquier embarazo anterior.  RIESGOS Y COMPLICACIONES  En general, se trata de un procedimiento seguro. Sin embargo, pueden ocurrir complicaciones, por ejemplo:  · Infección.  · Hemorragia.  · Lesión en los órganos circundantes.  · Efectos secundarios de los anestésicos.  · Fallas en el procedimiento.  Este procedimiento puede aumentar el riesgo de una clase de embarazo en el que el óvulo fertilizado se adhiere a la parte externa del útero (embarazo ectópico).  ANTES DEL PROCEDIMIENTO  · Consulte al médico si debe hacer o no lo siguiente:  ? Cambiar o suspender los medicamentos que toma habitualmente. Esto es muy importante si toma medicamentos para la diabetes o anticoagulantes.  ? Tomar medicamentos como aspirina e ibuprofeno. Estos medicamentos pueden tener un efecto anticoagulante en la sangre. No tome estos medicamentos antes del procedimiento si el médico le indica que no lo haga.   · Siga las indicaciones del médico respecto de las restricciones para las comidas y las bebidas.  · Haga planes para que una persona lo lleve de vuelta a su casa después del procedimiento.  · Si se va a su casa inmediatamente después del procedimiento, planifique que alguien se quede con usted durante 24 horas.  PROCEDIMIENTO  · Le administrarán uno o más de los siguientes medicamentos:  ? Un medicamento para ayudarlo a relajarse (sedante).  ? Un medicamento para adormecer la zona (anestesia local).  ? Un medicamento que lo hará dormir (anestesia general).  ? Un medicamento que se inyecta en una zona del cuerpo para adormecer toda la región que se encuentra por debajo del lugar de la inyección (anestesia regional).  · Le colocarán una vía intravenosa (IV) en una de las venas. Se usará para administrarle medicamentos y líquidos durante el procedimiento.  · Es posible que le vacíen la vejiga con una pequeña sonda (catéter).  · Si le administran anestesia general, le introducirán un tubo en la garganta para ayudarla a respirar.  · Le harán dos cortes pequeños (incisiones) en la parte inferior del abdomen y cerca del ombligo.  · Se inflará el abdomen con un gas. Esto permitirá que el cirujano vea mejor y le dará más espacio para trabajar.  · A través de una de las incisiones, le introducirán en el abdomen un tubo delgado con una luz (laparoscopio) y una cámara. A través de la otra incisión, le introducirán otros instrumentos pequeños.  · Las trompas de Falopio se atarán o quemarán (cauterizarán), o se obstruirán con un clip, un   anillo o una abrazadera. Es posible que se extraiga una parte pequeña en el centro de cada trompa de Falopio.  · Se le extraerá el gas del abdomen.  · Las incisiones se cerrarán con puntos (suturas).  · Se colocará un vendaje (apósito) sobre las incisiones.  Este procedimiento puede variar según el médico y el hospital.  DESPUÉS DEL PROCEDIMIENTO   · Le controlarán con frecuencia la presión arterial, la frecuencia cardíaca, la frecuencia respiratoria y el nivel de oxígeno en la sangre hasta que haya desaparecido el efecto de los medicamentos administrados.  · Le administrarán medicamentos para aliviar el dolor, las náuseas y los vómitos, según sea necesario.  Esta información no tiene como fin reemplazar el consejo del médico. Asegúrese de hacerle al médico cualquier pregunta que tenga.  Document Released: 09/27/2005 Document Revised: 01/19/2016 Document Reviewed: 09/07/2015  Elsevier Interactive Patient Education © 2018 Elsevier Inc.

## 2018-07-19 NOTE — Progress Notes (Signed)
Obstetrics and Gynecology Annual Patient Evaluation  Appointment Date: 07/19/2018  OBGYN Clinic: [facility]  Primary Care Provider: None  Referring Provider: Self  Chief Complaint:  Chief Complaint  Patient presents with  . Gynecologic Exam    History of Present Illness: Gabriela Donovan is a 37 y.o. Hispanic [id number] (No LMP recorded.), seen for the above chief complaint. Her past medical history is significant for HTN  Patient has had Nexplaon since June 2018 and has had AUB since placement. Last month it was persistent and lasted all month with some clots and prior to that she had bleeding 3x/month and she would like it out.    No breast s/s, nausea, vomiting, abdominal pain, dysuria, hematuria, vaginal itching,, diarrhea, constipation, blood in BMs  Review of Systems: as noted in the History of Present Illness.  Patient Active Problem List   Diagnosis Date Noted  . Essential hypertension 03/10/2015     Past Medical History:  Past Medical History:  Diagnosis Date  . Essential hypertension 03/10/2015  . Gestational diabetes     Past Surgical History:  Past Surgical History:  Procedure Laterality Date  . CHOLECYSTECTOMY      Past Obstetrical History:  OB History  Gravida Para Term Preterm AB Living  3 2 2   1 2   SAB TAB Ectopic Multiple Live Births  1     0 2    # Outcome Date GA Lbr Len/2nd Weight Sex Delivery Anes PTL Lv  3 Term 03/11/15 [redacted]w[redacted]d 01:53 / 00:07 6 lb 1 oz (2.75 kg) M Vag-Spont EPI  LIV     Birth Comments: No anomalies noted  2 Term 01/2009 [redacted]w[redacted]d  5 lb 11 oz (2.58 kg) F Vag-Spont  N LIV  1 SAB             Past Gynecological History: As per HPI. History of Pap Smear(s): Yes.   Last pap 2018, which was NILM/HPV negative She is currently using Nexplanon for contraception.   Social History:  Social History   Socioeconomic History  . Marital status: Significant Other    Spouse name: Not on file  . Number of children: Not on  file  . Years of education: Not on file  . Highest education level: Not on file  Occupational History  . Not on file  Social Needs  . Financial resource strain: Not on file  . Food insecurity:    Worry: Not on file    Inability: Not on file  . Transportation needs:    Medical: Not on file    Non-medical: Not on file  Tobacco Use  . Smoking status: Never Smoker  . Smokeless tobacco: Never Used  Substance and Sexual Activity  . Alcohol use: No  . Drug use: No  . Sexual activity: Yes    Birth control/protection: None  Lifestyle  . Physical activity:    Days per week: Not on file    Minutes per session: Not on file  . Stress: Not on file  Relationships  . Social connections:    Talks on phone: Not on file    Gets together: Not on file    Attends religious service: Not on file    Active member of club or organization: Not on file    Attends meetings of clubs or organizations: Not on file    Relationship status: Not on file  . Intimate partner violence:    Fear of current or ex partner: Not on file  Emotionally abused: Not on file    Physically abused: Not on file    Forced sexual activity: Not on file  Other Topics Concern  . Not on file  Social History Narrative  . Not on file    Family History:  Family History  Problem Relation Age of Onset  . Diabetes Mother   . Hypertension Mother   . Hypertension Father    She denies any female cancers  Medications Nexplanon  Allergies Patient has no known allergies.   Physical Exam:  BP (!) 154/80   Pulse 62   Wt 174 lb (78.9 kg)   BMI 29.87 kg/m  Body mass index is 29.87 kg/m. Weight last year: 178lbs General appearance: Well nourished, well developed female in no acute distress.  Neck:  Supple, normal appearance, and no thyromegaly  Cardiovascular: normal s1 and s2.  No murmurs, rubs or gallops. Respiratory:  Clear to auscultation bilateral. Normal respiratory effort Abdomen: positive bowel sounds and no  masses, hernias; diffusely non tender to palpation, non distended Breasts: breasts appear normal, no suspicious masses, no skin or nipple changes or axillary nodes, and normal palpation. Neuro/Psych:  Normal mood and affect.  Skin:  Warm and dry.  Lymphatic:  No inguinal lymphadenopathy.   Pelvic exam: is not limited by body habitus EGBUS: within normal limits, Vagina: within normal limits and with no blood or discharge in the vault, Cervix: normal appearing cervix without tenderness, discharge or lesions. Uterus:  nonenlarged and non tender and Adnexa:  normal adnexa and no mass, fullness, tenderness Rectovaginal: deferred  Laboratory: none  Radiology: none  Assessment: pt doing well  Plan:  Nexplanon removed today. Options d/w her and I told her I wouldn't recommend estrogen containing options; she'd like to do the progestin only pill and Rx sent in and pt told to wait a week before considering it effective and about it's 3 hour window. Pt to let me know if AUB continues. Pt also considering BTL and will let us know if she'd like to pursue that route  Also d/w her re: resources for getting a PCP, again. Pt to consider  RTC 1 year  Haslett Bing, Montez Hageman MD Attending [facility])

## 2018-07-19 NOTE — Procedures (Signed)
Nexplanon Removal Procedure Note Prior to the procedure being performed, the patient (or guardian) was asked to state their full name, date of birth, type of procedure being performed and the exact location of the operative site. This information was then checked against the documentation in the patient's chart. Prior to the procedure being performed, a "time out" was performed by the physician that confirmed the correct patient, procedure and site.  After informed consent was obtained, the patient's left arm was examined and the Nexplanon rod was noted to be easily palpable. The area was cleaned with alcohol then local anesthesia was infiltrated with 3 ml of 1% lidocaine. The area was prepped with betadine. Using sterile technique, the Nexplanon device easily brought to the incision site. The capsule was scrapped off with the scalpel, the Nexplanon grasped with hemostats, and easily removed; the removal site was hemostatic. The Nexplanon was inspected and noted to be intact.  A steri-strip and a pressure dressing was applied.  The patient tolerated the procedure well.  She chose to do POPs for birth control.   Cornelia Copa MD Attending [facility])

## 2019-03-12 ENCOUNTER — Telehealth: Payer: Self-pay | Admitting: Family Medicine

## 2019-03-12 NOTE — Telephone Encounter (Signed)
Called patient regarding refill. She states she just picked up the final three packets and was wondering about coming in for her annual for additional refills. Told patient yes, she should call us a month prior to needing her annual to schedule that appt. Also discussed when she starts her final pill pack to call us for additional refills to last until her appt. Patient verbalized understanding & had no questions.

## 2019-03-12 NOTE — Telephone Encounter (Signed)
Patient is calling to get a refill on her [facility] Pills

## 2019-04-23 ENCOUNTER — Other Ambulatory Visit: Payer: Self-pay

## 2019-04-23 ENCOUNTER — Ambulatory Visit (HOSPITAL_COMMUNITY)
Admission: EM | Admit: 2019-04-23 | Discharge: 2019-04-23 | Disposition: A | Discharge status: Home / Self Care | Payer: Medicaid Other | Attending: Internal Medicine | Admitting: Internal Medicine

## 2019-04-23 ENCOUNTER — Encounter (HOSPITAL_COMMUNITY): Payer: Self-pay

## 2019-04-23 DIAGNOSIS — S161XXA Strain of muscle, fascia and tendon at neck level, initial encounter: Secondary | ICD-10-CM

## 2019-04-23 DIAGNOSIS — I10 Essential (primary) hypertension: Secondary | ICD-10-CM

## 2019-04-23 MED ORDER — NAPROXEN 375 MG PO TABS: 375.0000 mg | ORAL_TABLET | Freq: Two times a day (BID) | ORAL | 0 refills | Status: DC

## 2019-04-23 MED ORDER — KETOROLAC TROMETHAMINE 60 MG/2ML IM SOLN
INTRAMUSCULAR | Status: AC
  Filled 2019-04-23: qty 2

## 2019-04-23 MED ORDER — KETOROLAC TROMETHAMINE 60 MG/2ML IM SOLN
60.0000 mg | Freq: Once | INTRAMUSCULAR | Status: AC
  Administered 2019-04-23: 60 mg via INTRAMUSCULAR

## 2019-04-23 MED ORDER — CYCLOBENZAPRINE HCL 10 MG PO TABS: 10.0000 mg | ORAL_TABLET | Freq: Two times a day (BID) | ORAL | 0 refills | Status: DC | PRN

## 2019-04-23 NOTE — ED Notes (Signed)
Bed: UC01 Expected date: 04/23/19 Expected time: 2:01 PM Means of arrival:  Comments: For APPOINTMENT

## 2019-04-23 NOTE — ED Provider Notes (Signed)
[place]    CSN: [id number] Arrival date & time: 04/23/19  1325      History   Chief Complaint Chief Complaint  Patient presents with  . Appointment    1:50  . Neck Pain    HPI Annleigh Inoa is a 38 y.o. female with a history of hypertension managed with any antihypertensive agents comes to urgent care with a few hours history of right-sided neck pain.  Neck pain started suddenly this morning.  Pain is sharp in nature, severe 10 out of 10.  Aggravated by movement.  No known relieving factors.  Pain radiating to right shoulder currently right arm.  Movement of the arm aggravates the pain.  Patient does not have any numbness or tingling.  HPI  Past Medical History:  Diagnosis Date  . Essential hypertension 03/10/2015  . Gestational diabetes     Patient Active Problem List   Diagnosis Date Noted  . Essential hypertension 03/10/2015    Past Surgical History:  Procedure Laterality Date  . CHOLECYSTECTOMY      OB History    Gravida  3   Para  2   Term  2   Preterm      AB  1   Living  2     SAB  1   TAB      Ectopic      Multiple  0   Live Births  2            Home Medications    Prior to Admission medications   Medication Sig Start Date End Date Taking? Authorizing Provider  norethindrone (CAMILA) 0.35 MG tablet One pill by mouth at the same time every day. 07/19/18  Yes Aletha Halim, MD  acetaminophen (TYLENOL) 500 MG tablet Take 500 mg by mouth every 6 (six) hours as needed for mild pain or headache. Reported on 03/18/2016    [provider]  fluconazole (DIFLUCAN) 150 MG tablet TAKE 1 TABLET BY MOUTH AS ONE DOSE Patient not taking: Reported on 07/19/2018 02/07/18   Woodroe Mode, MD    Family History Family History  Problem Relation Age of Onset  . Diabetes Mother   . Hypertension Mother   . Hypertension Father     Social History Social History   Tobacco Use  . Smoking status: Never Smoker  . Smokeless  tobacco: Never Used  Substance Use Topics  . Alcohol use: No  . Drug use: No     Allergies   Patient has no known allergies.   Review of Systems Review of Systems  Constitutional: Negative for activity change, chills, fatigue and fever.  HENT: Negative.   Respiratory: Negative for cough, chest tightness, shortness of breath and wheezing.   Gastrointestinal: Negative.   Endocrine: Negative.   Genitourinary: Negative.  Negative for dysuria.  Musculoskeletal: Positive for myalgias, neck pain and neck stiffness. Negative for arthralgias, back pain and joint swelling.  Skin: Negative.   Neurological: Negative for dizziness, weakness, light-headedness and headaches.  Psychiatric/Behavioral: Negative.      Physical Exam Triage Vital Signs ED Triage Vitals  Enc Vitals Group     BP 04/23/19 1350 (!) 158/97     Pulse Rate 04/23/19 1350 81     Resp 04/23/19 1350 16     Temp 04/23/19 1350 98.7 F (37.1 C)     Temp Source 04/23/19 1350 Oral     SpO2 04/23/19 1350 100 %     Weight --  Height --      Head Circumference --      Peak Flow --      Pain Score 04/23/19 1348 10     Pain Loc --      Pain Edu? --      Excl. in GC? --    No data found.  Updated Vital Signs BP (!) 158/97 (BP Location: Left Arm)   Pulse 81   Temp 98.7 F (37.1 C) (Oral)   Resp 16   SpO2 100%   Visual Acuity Right Eye Distance:   Left Eye Distance:   Bilateral Distance:    Right Eye Near:   Left Eye Near:    Bilateral Near:     Physical Exam Constitutional:      General: She is not in acute distress.    Appearance: Normal appearance. She is not ill-appearing, toxic-appearing or diaphoretic.  HENT:     Right Ear: Tympanic membrane normal.     Left Ear: Tympanic membrane normal. There is no impacted cerumen.     Mouth/Throat:     Pharynx: No oropharyngeal exudate or posterior oropharyngeal erythema.  Eyes:     Conjunctiva/sclera: Conjunctivae normal.  Neck:     Musculoskeletal:  Muscular tenderness present. No neck rigidity.     Vascular: No carotid bruit.  Cardiovascular:     Rate and Rhythm: Normal rate and regular rhythm.     Pulses: Normal pulses.     Heart sounds: Normal heart sounds.  Pulmonary:     Effort: Pulmonary effort is normal. No respiratory distress.     Breath sounds: No wheezing or rales.  Abdominal:     General: Bowel sounds are normal.  Musculoskeletal:        General: Tenderness present. No swelling.     Comments: Full range of motion of the neck with pain.  Tenderness to palpation over the trapezius muscles on the right.  Power is 5/5.  No numbness or tingling.  Lymphadenopathy:     Cervical: No cervical adenopathy.  Neurological:     General: No focal deficit present.     Mental Status: She is alert and oriented to person, place, and time.     Cranial Nerves: No cranial nerve deficit.     Sensory: No sensory deficit.     Motor: No weakness.      UC Treatments / Results  Labs (all labs ordered are listed, but only abnormal results are displayed) Labs Reviewed - No data to display  EKG   Radiology No results found.  Procedures Procedures (including critical care time)  Medications Ordered in UC Medications - No data to display  Initial Impression / Assessment and Plan / UC Course  I have reviewed the triage vital signs and the nursing notes.  Pertinent labs & imaging results that were available during my care of the patient were reviewed by me and considered in my medical decision making (see chart for details).     1.  Neck pain with stiffness (likely musculoskeletal): Toradol 60 mg IM Naproxen 375 mg twice daily Flexeril 10 mg orally twice daily as needed Gentle range of motion Warm compress as needed If patient experiences numbness, weakness or worsening neck pain she is advised to return to urgent care to be reevaluated. Final Clinical Impressions(s) / UC Diagnoses   Final diagnoses:  None   Discharge  Instructions   None    ED Prescriptions    None     Controlled Substance  Prescriptions [place] Controlled Substance Registry consulted? No   Merrilee JanskyLamptey, Tahje Borawski O, MD 04/23/19 1445

## 2019-04-23 NOTE — ED Triage Notes (Signed)
Patient presents to Urgent Care with complaints of neck pain that radiates down the right shoulder blade and down her right arm since 10 days ago. Patient reports she went to the ED for same and they gave her meds that helped until this morning it seemed to cramp up again.

## 2019-04-24 ENCOUNTER — Other Ambulatory Visit: Payer: Self-pay

## 2019-04-24 ENCOUNTER — Ambulatory Visit (HOSPITAL_COMMUNITY)
Admission: EM | Admit: 2019-04-24 | Discharge: 2019-04-24 | Disposition: A | Discharge status: Home / Self Care | Payer: Medicaid Other | Attending: Emergency Medicine | Admitting: Emergency Medicine

## 2019-04-24 ENCOUNTER — Encounter (HOSPITAL_COMMUNITY): Payer: Self-pay | Admitting: Emergency Medicine

## 2019-04-24 DIAGNOSIS — M62838 Other muscle spasm: Secondary | ICD-10-CM

## 2019-04-24 DIAGNOSIS — M5412 Radiculopathy, cervical region: Secondary | ICD-10-CM

## 2019-04-24 MED ORDER — IBUPROFEN 600 MG PO TABS: 600.0000 mg | ORAL_TABLET | Freq: Four times a day (QID) | ORAL | 0 refills | Status: DC | PRN

## 2019-04-24 MED ORDER — METHYLPREDNISOLONE 4 MG PO TBPK: ORAL_TABLET | Freq: Every day | ORAL | 0 refills | Status: DC

## 2019-04-24 MED ORDER — TIZANIDINE HCL 4 MG PO TABS: 4.0000 mg | ORAL_TABLET | Freq: Three times a day (TID) | ORAL | 0 refills | Status: DC | PRN

## 2019-04-24 NOTE — ED Triage Notes (Signed)
Right side of head, right neck, upper back and pain radiating down right arm.  Seen yesterday and reports pain is worse today.

## 2019-04-24 NOTE — ED Provider Notes (Signed)
HPI  SUBJECTIVE:  Gabriela Donovan is a 38 y.o. female who presents with persistent right-sided neck/upper shoulder and back pain that she described as sharp, constant.  States that she cannot turn her head to the left.  She had this 2 weeks ago, seen in a [place] urgent care, was prescribed steroids, pain medicine and an unknown muscle relaxant with resolution in her symptoms.  Her current episode started 2 days ago.  She is right-handed.  She denies trauma, change in physical activity, heavy lifting or wearing a heavy bag on her shoulder.  She denies sleeping on her neck wrong.  No fevers, sore throat, rash.  No limitation of motion of the shoulder, no chest pain, shortness of breath.  States that her right hand "feels numb" for the past 2 weeks.  She denies tingling, hand weakness.  Seen here yesterday for sharp right-sided severe neck pain.  Thought to have musculoskeletal neck pain, given Toradol, was sent home with Naprosyn and Flexeril.  She has also tried ibuprofen without improvement in her symptoms.  She states that her symptoms feel better when she raises her arm above her head and rests it on the top of her head.  Symptoms are worse with turning her head to the left, flexion extension and with lying down.  Past medical history negative for osteoporosis, neck injury, prolonged steroid use, diabetes, osteoarthritis.  LMP: End of June.  Denies the possibility of being pregnant.  PMD: None.  Past Medical History:  Diagnosis Date  . Essential hypertension 03/10/2015  . Gestational diabetes     Past Surgical History:  Procedure Laterality Date  . CHOLECYSTECTOMY      Family History  Problem Relation Age of Onset  . Diabetes Mother   . Hypertension Mother   . Hypertension Father     Social History   Tobacco Use  . Smoking status: Never Smoker  . Smokeless tobacco: Never Used  Substance Use Topics  . Alcohol use: No  . Drug use: No    No current facility-administered medications for  this encounter.   Current Outpatient Medications:  .  ibuprofen (ADVIL) 600 MG tablet, Take 1 tablet (600 mg total) by mouth every 6 (six) hours as needed., Disp: 30 tablet, Rfl: 0 .  methylPREDNISolone (MEDROL DOSEPAK) 4 MG TBPK tablet, Take by mouth daily. Follow package instructions, Disp: 21 tablet, Rfl: 0 .  norethindrone (CAMILA) 0.35 MG tablet, One pill by mouth at the same time every day., Disp: 3 Package, Rfl: 3 .  tiZANidine (ZANAFLEX) 4 MG tablet, Take 1 tablet (4 mg total) by mouth every 8 (eight) hours as needed for muscle spasms., Disp: 30 tablet, Rfl: 0  No Known Allergies   ROS  As noted in HPI.   Physical Exam  BP (!) 138/99 (BP Location: Left Arm)   Pulse 85   Temp 99.2 F (37.3 C) (Oral)   Resp 16   LMP 04/05/2019   SpO2 100%   Constitutional: Well developed, well nourished, no acute distress Eyes:  EOMI, conjunctiva normal bilaterally HENT: Normocephalic, atraumatic,mucus membranes moist Neck: No meningismus. Respiratory: Normal inspiratory effort Cardiovascular: Normal rate GI: nondistended skin: No rash, skin intact Musculoskeletal: No C-spine tenderness.  Positive right-sided trapezial and rhomboid tenderness, muscle spasm.  No bony tenderness over the shoulder, scapula.  Patient has full AROM of the right shoulder.  Sensation to light touch and temperature grossly intact and equal over her entire arm and hand.  She is neurovascularally intact in the median/radial/ulnar  distribution.  No objective sensory defects in the hand.  RP 2+ and equal.  BR 2+/2+ and equal bilaterally.  Strength 5/5 and equal bilaterally. Neurologic: Alert & oriented x 3, no focal neuro deficits Psychiatric: Speech and behavior appropriate   ED Course   Medications - No data to display  No orders of the defined types were placed in this encounter.   No results found for this or any previous visit (from the past 24 hour(s)). No results found.  ED Clinical Impression  1.  Trapezius muscle spasm   2. Cervical radiculopathy      ED Assessment/Plan  Urgent care records reviewed.  As noted in HPI.  Presentation most consistent with a musculoskeletal cause although she may have some cervical radiculopathy.  However she has no objective findings of weakness.  Doubt cardiac cause, stroke, carotid artery dissection.  She has no bony tenderness, history of arthritis, osteoporosis or trauma to the neck, deferring C-spine today.  Discontinue Naprosyn, Flexeril, will start on Zanaflex, ibuprofen 600 mg combined with 1 g of Tylenol 3-4 times a day as needed for pain.  Will send home with a Medrol Dosepak as she states that that helped her significantly when she was seen at the urgent care in [place] 2 weeks ago.  Follow-up with orthopedics if not better after finishing the medications, Dr. Dion SaucierLandau on call.  To the ER for chest pain, fevers, pain not controlled with medications, weakness of the hand, or any other concerns.  Discussed  MDM, treatment plan, and plan for follow-up with patient. Discussed sn/sx that should prompt return to the ED. patient agrees with plan.   Meds ordered this encounter  Medications  . ibuprofen (ADVIL) 600 MG tablet    Sig: Take 1 tablet (600 mg total) by mouth every 6 (six) hours as needed.    Dispense:  30 tablet    Refill:  0  . tiZANidine (ZANAFLEX) 4 MG tablet    Sig: Take 1 tablet (4 mg total) by mouth every 8 (eight) hours as needed for muscle spasms.    Dispense:  30 tablet    Refill:  0  . methylPREDNISolone (MEDROL DOSEPAK) 4 MG TBPK tablet    Sig: Take by mouth daily. Follow package instructions    Dispense:  21 tablet    Refill:  0    *This clinic note was created using Dragon dictation software. Therefore, there may be occasional mistakes despite careful proofreading.   ?    Domenick GongMortenson, Adalina Dopson, MD 04/24/19 1119

## 2019-05-17 ENCOUNTER — Telehealth (INDEPENDENT_AMBULATORY_CARE_PROVIDER_SITE_OTHER): Payer: Self-pay | Admitting: Obstetrics and Gynecology

## 2019-05-17 DIAGNOSIS — Z3041 Encounter for surveillance of contraceptive pills: Secondary | ICD-10-CM

## 2019-05-17 NOTE — Telephone Encounter (Signed)
Patient is requesting [facility] until she can make her appointment in October.

## 2019-05-21 MED ORDER — NORETHINDRONE 0.35 MG PO TABS: ORAL_TABLET | ORAL | 0 refills | Status: DC

## 2019-05-21 NOTE — Addendum Note (Signed)
Addended by: Donn Pierini on: 05/21/2019 02:55 PM   Modules accepted: Orders

## 2019-05-21 NOTE — Telephone Encounter (Signed)
Called to speak with pt in regards to her earlier call with assistance on [place] # [phone].   Pt did not answer. LM for pt to call the office with further questions. Lm that I sent a prescription to her Pharmacy. Interpreter then called the pt back and answered. Pt reports she has one pack left and is working on making ,a follow up GYN appt.   Refill sent to pharmacy and pt to call the office to schedule gynecological exam.

## 2019-08-03 ENCOUNTER — Ambulatory Visit (INDEPENDENT_AMBULATORY_CARE_PROVIDER_SITE_OTHER): Payer: Medicaid Other | Admitting: Obstetrics and Gynecology

## 2019-08-03 ENCOUNTER — Encounter: Payer: Self-pay | Admitting: Obstetrics and Gynecology

## 2019-08-03 ENCOUNTER — Other Ambulatory Visit: Payer: Self-pay

## 2019-08-03 ENCOUNTER — Other Ambulatory Visit (HOSPITAL_COMMUNITY)
Admission: RE | Admit: 2019-08-03 | Discharge: 2019-08-03 | Disposition: A | Discharge status: Home / Self Care | Payer: Medicaid Other | Source: Ambulatory Visit | Attending: Obstetrics and Gynecology | Admitting: Obstetrics and Gynecology

## 2019-08-03 VITALS — BP 136/84 | HR 72 | Ht 64.0 in | Wt 183.0 lb

## 2019-08-03 DIAGNOSIS — Z01419 Encounter for gynecological examination (general) (routine) without abnormal findings: Secondary | ICD-10-CM | POA: Diagnosis present

## 2019-08-03 DIAGNOSIS — Z0000 Encounter for general adult medical examination without abnormal findings: Secondary | ICD-10-CM

## 2019-08-03 MED ORDER — METRONIDAZOLE 500 MG PO TABS
500.0000 mg | ORAL_TABLET | Freq: Two times a day (BID) | ORAL | 1 refills | Status: DC
Start: 2019-08-03 — End: 2020-08-04

## 2019-08-03 MED ORDER — NORETHINDRONE 0.35 MG PO TABS
1.0000 | ORAL_TABLET | Freq: Every day | ORAL | 3 refills | Status: DC
Start: 2019-08-03 — End: 2020-08-04

## 2019-08-03 MED ORDER — FLUCONAZOLE 150 MG PO TABS: 150.0000 mg | ORAL_TABLET | ORAL | 1 refills | Status: AC

## 2019-08-03 NOTE — Progress Notes (Signed)
Obstetrics and Gynecology Annual Patient Evaluation  Appointment Date: 08/03/2019  OBGYN Clinic: [facility]  Primary Care Provider: None  Chief Complaint:  Chief Complaint  Patient presents with  . Gynecologic Exam    History of Present Illness: Gabriela Donovan is a 38 y.o. Hispanic [id number] (Patient's last menstrual period was 07/27/2019.), seen for the above chief complaint. Her past medical history is significant for HTN, BMI low 30s  Only endorses some vaginal itching a few days before and after her period for past 2-3 cycles. No vaginal discharge, breast s/s  Review of Systems: as noted in the History of Present Illness.   Past Medical History:  Past Medical History:  Diagnosis Date  . Essential hypertension 03/10/2015  . Gestational diabetes     Past Surgical History:  Past Surgical History:  Procedure Laterality Date  . CHOLECYSTECTOMY      Past Obstetrical History:  OB History  Gravida Para Term Preterm AB Living  3 2 2   1 2   SAB TAB Ectopic Multiple Live Births  1     0 2    # Outcome Date GA Lbr Len/2nd Weight Sex Delivery Anes PTL Lv  3 Term 03/11/15 [redacted]w[redacted]d 01:53 / 00:07 6 lb 1 oz (2.75 kg) M Vag-Spont EPI  LIV     Birth Comments: No anomalies noted  2 Term 01/2009 [redacted]w[redacted]d  5 lb 11 oz (2.58 kg) F Vag-Spont  N LIV  1 SAB             Past Gynecological History: As per HPI. Periods: qmonth, regular, 3-5d, not heavy or painful History of Pap Smear(s): Yes.   Last pap 2018, which was negative She is currently using oral progesterone-only contraceptive for contraception.   Social History:  Social History   Socioeconomic History  . Marital status: Significant Other    Spouse name: Not on file  . Number of children: Not on file  . Years of education: Not on file  . Highest education level: Not on file  Occupational History  . Not on file  Social Needs  . Financial resource strain: Not on file  . Food insecurity    Worry: Not on  file    Inability: Not on file  . Transportation needs    Medical: Not on file    Non-medical: Not on file  Tobacco Use  . Smoking status: Never Smoker  . Smokeless tobacco: Never Used  Substance and Sexual Activity  . Alcohol use: No  . Drug use: No  . Sexual activity: Yes    Birth control/protection: None  Lifestyle  . Physical activity    Days per week: Not on file    Minutes per session: Not on file  . Stress: Not on file  Relationships  . Social Herbalist on phone: Not on file    Gets together: Not on file    Attends religious service: Not on file    Active member of club or organization: Not on file    Attends meetings of clubs or organizations: Not on file    Relationship status: Not on file  . Intimate partner violence    Fear of current or ex partner: Not on file    Emotionally abused: Not on file    Physically abused: Not on file    Forced sexual activity: Not on file  Other Topics Concern  . Not on file  Social History Narrative  . Not on file  Family History:  Family History  Problem Relation Age of Onset  . Diabetes Mother   . Hypertension Mother   . Hypertension Father     Medications Gabriela Donovan had no medications administered during this visit. Current Outpatient Medications  Medication Sig Dispense Refill  . ibuprofen (ADVIL) 600 MG tablet Take 1 tablet (600 mg total) by mouth every 6 (six) hours as needed. 30 tablet 0  . tiZANidine (ZANAFLEX) 4 MG tablet Take 1 tablet (4 mg total) by mouth every 8 (eight) hours as needed for muscle spasms. 30 tablet 0  . norethindrone (CAMILA) 0.35 MG tablet Take 1 tablet (0.35 mg total) by mouth daily. 3 Package 3   No current facility-administered medications for this visit.     Allergies Patient has no known allergies.   Physical Exam:  BP 136/84   Pulse 72   Ht 5\' 4"  (1.626 m)   Wt 183 lb (83 kg)   LMP 07/27/2019   BMI 31.41 kg/m  Body mass index is 31.41 kg/m. Weight last year:  174 lbs General appearance: Well nourished, well developed female in no acute distress.  Neck:  Supple, normal appearance, and no thyromegaly  Cardiovascular: normal s1 and s2.  No murmurs, rubs or gallops. Respiratory:  Clear to auscultation bilateral. Normal respiratory effort Abdomen: positive bowel sounds and no masses, hernias; diffusely non tender to palpation, non distended Breasts: breasts appear normal, no suspicious masses, no skin or nipple changes or axillary nodes, and normal palpation. Neuro/Psych:  Normal mood and affect.  Skin:  Warm and dry.  Lymphatic:  No inguinal lymphadenopathy.   Pelvic exam: is not limited by body habitus EGBUS: within normal limits, Vagina: normal, scant light brown d/c. Cervix: normal appearing cervix without tenderness, discharge or lesions. Uterus:  nonenlarged and non tender and Adnexa:  normal adnexa and no mass, fullness, tenderness Rectovaginal: deferred  Laboratory: none  Radiology: none  Assessment: pt doing well  Plan:  1. Well woman exam with routine gynecological exam Routine care. Pt desires pap today so done. Pt has FP Medicaid Continue on POPs Will tx with diflucan and flagyl and possible bv and yeast. D/w her to try loose fitting clothes, cloth underwear, mild soap around the time of periods to see if that helps. She uses tampons. If continues but is self limiting I told her could be related to cycles but I wouldn't worry about it - Cytology - PAP( [place])  RTC 1 year  07/29/2019 MD Attending Center for [facility] [employer])

## 2019-08-06 ENCOUNTER — Telehealth: Payer: Self-pay | Admitting: Obstetrics and Gynecology

## 2019-08-06 NOTE — Telephone Encounter (Signed)
Patient called in stating that she was here last week for an annual and Dr. Ilda Basset gave her a prescriptions. Patient stated that the prescription caused her swelling and an allergic reaction and wants to know if there is another medication that she can take. Patient instructed that I message will be sent to the nurses and they will contact her as soon as they can. Patient verbalized understanding and message sent to clinical pool.

## 2019-08-06 NOTE — Telephone Encounter (Signed)
I called Glorianne with [place] interpreter [phone] but patient states does not need interpreter. She speaks good [place]. She reports she took the [place] on Saturday 08/04/19 and she  Had allergic reaction including itching on her face, redness on her face, swollen lips . She denies SOB. She also states she has not taken the flagyl or ocp.  I asked what she took to  Help . She states she took zyrtec Saturday but it didn't help. She states she took Loratidine Sunday and it didn't really help. I asked how her symptoms were today- she states lips still sore, face still red.  I advised her to never take diflucan again . I advised her may take benadryl over the counter- follow package directions.. I asked if she was having vaginal itching or discharge. She states a little and I informed her we may order a cream. She states she has taken Hydrocortisone and monistat and neither provided relief.  I advised her to wait to take the flagyl until this is resolved and then when she does - to take with food. I informed her I will check with provider and see what he advises and then we will get back to her.  Elizah Lydon,RN

## 2019-08-07 LAB — CYTOLOGY - PAP
Comment: NEGATIVE
Diagnosis: NEGATIVE
High risk HPV: NEGATIVE

## 2019-08-09 NOTE — Telephone Encounter (Signed)
LM for pt with [place] # [phone] that we have sent another Rx to her [facility] on [address].  Please take that medication instead.  If you have any questions to please give the office a call.

## 2019-08-09 NOTE — Telephone Encounter (Signed)
Yeah the terazol topical is fine to try

## 2019-08-13 ENCOUNTER — Other Ambulatory Visit: Payer: Self-pay | Admitting: Lactation Services

## 2019-08-13 ENCOUNTER — Telehealth: Payer: Self-pay | Admitting: Family Medicine

## 2019-08-13 MED ORDER — TERCONAZOLE 0.4 % VA CREA: 1.0000 | TOPICAL_CREAM | Freq: Every day | VAGINAL | 0 refills | Status: DC

## 2019-08-13 NOTE — Telephone Encounter (Signed)
Patient called in stating that she has been trying to get in contact with the office about her new prescription that she was prescribed because she had an allergic reaction to the old one. She stated the pharmacy said that they do not have the new prescription. Would like a call back.

## 2019-08-13 NOTE — Telephone Encounter (Signed)
Pt answered per My Chart message.

## 2020-01-21 ENCOUNTER — Ambulatory Visit: Payer: Medicaid Other | Admitting: Obstetrics and Gynecology

## 2020-08-04 ENCOUNTER — Encounter: Payer: Self-pay | Admitting: Obstetrics and Gynecology

## 2020-08-04 ENCOUNTER — Other Ambulatory Visit: Payer: Self-pay

## 2020-08-04 ENCOUNTER — Ambulatory Visit (INDEPENDENT_AMBULATORY_CARE_PROVIDER_SITE_OTHER): Payer: Medicaid Other | Admitting: Obstetrics and Gynecology

## 2020-08-04 VITALS — BP 146/87 | HR 80 | Ht 64.0 in | Wt 177.0 lb

## 2020-08-04 DIAGNOSIS — N921 Excessive and frequent menstruation with irregular cycle: Secondary | ICD-10-CM | POA: Diagnosis not present

## 2020-08-04 DIAGNOSIS — Z3041 Encounter for surveillance of contraceptive pills: Secondary | ICD-10-CM

## 2020-08-04 DIAGNOSIS — Z01419 Encounter for gynecological examination (general) (routine) without abnormal findings: Secondary | ICD-10-CM | POA: Diagnosis not present

## 2020-08-04 MED ORDER — SLYND 4 MG PO TABS
1.0000 | ORAL_TABLET | Freq: Every day | ORAL | 3 refills | Status: DC
Start: 2020-08-04 — End: 2021-07-24

## 2020-08-04 NOTE — Progress Notes (Signed)
Obstetrics and Gynecology Annual Patient Evaluation  Appointment Date: 08/04/2020  OBGYN Clinic: [facility]  Primary Care Provider: None  Chief Complaint:  Annual exam for [facility] Medicaid  History of Present Illness: Gabriela Donovan is a 39 y.o. Hispanic [id number] (Patient's last menstrual period was 07/20/2020 (within days).), seen for the above chief complaint. Her past medical history is significant for HTN, BMI 30  Patient has been on POPs for nearly two years but recently started having AUB on it recently and pain with periods  Patient had reaction to diflucan (facial) but did fine with the terazol.   Review of Systems: Pertinent items noted in HPI and remainder of comprehensive ROS otherwise negative.    Past Medical History:  Past Medical History:  Diagnosis Date  . Essential hypertension 03/10/2015  . Gestational diabetes     Past Surgical History:  Past Surgical History:  Procedure Laterality Date  . CHOLECYSTECTOMY      Past Obstetrical History:  OB History  Gravida Para Term Preterm AB Living  3 2 2   1 2   SAB TAB Ectopic Multiple Live Births  1     0 2    # Outcome Date GA Lbr Len/2nd Weight Sex Delivery Anes PTL Lv  3 Term 03/11/15 [redacted]w[redacted]d 01:53 / 00:07 6 lb 1 oz (2.75 kg) M Vag-Spont EPI  LIV     Birth Comments: No anomalies noted  2 Term 01/2009 [redacted]w[redacted]d  5 lb 11 oz (2.58 kg) F Vag-Spont  N LIV  1 SAB            Past Gynecological History: As per HPI. History of Pap Smear(s): Yes.   Last pap 2021, which was neg and neg hpv She is currently using Camila for contraception.  -she has used nexplanon in the past and did not like it   Social History:  Social History   Socioeconomic History  . Marital status: Married    Spouse name: Not on file  . Number of children: Not on file  . Years of education: Not on file  . Highest education level: Not on file  Occupational History  . Not on file  Tobacco Use  . Smoking status:  Never Smoker  . Smokeless tobacco: Never Used  Vaping Use  . Vaping Use: Never used  Substance and Sexual Activity  . Alcohol use: No  . Drug use: No  . Sexual activity: Yes    Birth control/protection: None  Other Topics Concern  . Not on file  Social History Narrative  . Not on file   Social Determinants of Health   Financial Resource Strain:   . Difficulty of Paying Living Expenses: Not on file  Food Insecurity:   . Worried About 2022 in the Last Year: Not on file  . Ran Out of Food in the Last Year: Not on file  Transportation Needs:   . Lack of Transportation (Medical): Not on file  . Lack of Transportation (Non-Medical): Not on file  Physical Activity:   . Days of Exercise per Week: Not on file  . Minutes of Exercise per Session: Not on file  Stress:   . Feeling of Stress : Not on file  Social Connections:   . Frequency of Communication with Friends and Family: Not on file  . Frequency of Social Gatherings with Friends and Family: Not on file  . Attends Religious Services: Not on file  . Active Member of Clubs or  Organizations: Not on file  . Attends Banker Meetings: Not on file  . Marital Status: Not on file  Intimate Partner Violence:   . Fear of Current or Ex-Partner: Not on file  . Emotionally Abused: Not on file  . Physically Abused: Not on file  . Sexually Abused: Not on file    Family History:  Family History  Problem Relation Age of Onset  . Diabetes Mother   . Hypertension Mother   . Hypertension Father     Medications Gabriela Donovan had no medications administered during this visit. Current Outpatient Medications  Medication Sig Dispense Refill  . norethindrone (CAMILA) 0.35 MG tablet Take 1 tablet (0.35 mg total) by mouth daily. 3 Package 3   No current facility-administered medications for this visit.    Allergies Diflucan [fluconazole]   Physical Exam:  BP (!) 146/87   Pulse 80   Ht 5\' 4"  (1.626 m)   Wt  177 lb (80.3 kg)   LMP 07/20/2020 (Within Days)   BMI 30.38 kg/m  Body mass index is 30.38 kg/m. General appearance: Well nourished, well developed female in no acute distress.  Neck:  Supple, normal appearance, and no thyromegaly  Cardiovascular: normal s1 and s2.  No murmurs, rubs or gallops. Respiratory:  Clear to auscultation bilateral. Normal respiratory effort Abdomen: positive bowel sounds and no masses, hernias; diffusely non tender to palpation, non distended Breasts: breasts appear normal, no suspicious masses, no skin or nipple changes or axillary nodes, and normal palpation. Neuro/Psych:  Normal mood and affect.  Skin:  Warm and dry.  Lymphatic:  No inguinal lymphadenopathy.   Pelvic exam: is not limited by body habitus EGBUS: within normal limits Vagina: within normal limits and with no blood or discharge in the vault Cervix: normal appearing cervix without tenderness, discharge or lesions. Uterus:  nonenlarged and non tender Adnexa:  normal adnexa and no mass, fullness, tenderness Rectovaginal: deferred  Laboratory: none  Radiology: none  Assessment: pt stable  Plan:  1. Encounter for surveillance of contraceptive pills  2. Breakthrough bleeding on birth control pills Likely due to chronic Camila use. Options d/w her (depo provera, mirena, BTL, vasectomy) and she would like to stay on pills. I will try her on Slynd and recommended pt give 2-3 months to see if it works better and if not to let me know  3. GYN Recommended to her again re: PCP referral for HTN and resources given. Mammogram scholarship app given to patient.   RTC PRN  09/19/2020 MD Attending Center for [facility] [employer])

## 2020-08-04 NOTE — Progress Notes (Signed)
Patient has elevated phq9- declines to see Asher Muir, but aware how to set up an appt

## 2020-08-06 ENCOUNTER — Other Ambulatory Visit: Payer: Self-pay | Admitting: Obstetrics and Gynecology

## 2020-08-06 DIAGNOSIS — Z1231 Encounter for screening mammogram for malignant neoplasm of breast: Secondary | ICD-10-CM

## 2020-08-21 ENCOUNTER — Ambulatory Visit
Admission: RE | Admit: 2020-08-21 | Discharge: 2020-08-21 | Disposition: A | Discharge status: Home / Self Care | Payer: No Typology Code available for payment source | Source: Ambulatory Visit | Attending: Obstetrics and Gynecology | Admitting: Obstetrics and Gynecology

## 2020-08-21 ENCOUNTER — Other Ambulatory Visit: Payer: Self-pay

## 2020-08-21 DIAGNOSIS — Z1231 Encounter for screening mammogram for malignant neoplasm of breast: Secondary | ICD-10-CM

## 2020-10-16 DIAGNOSIS — U071 COVID-19: Secondary | ICD-10-CM | POA: Diagnosis not present

## 2021-06-08 ENCOUNTER — Telehealth: Payer: Self-pay

## 2021-06-08 NOTE — Telephone Encounter (Signed)
Drospirenone (SLYND) 4 MG TABS  Patient called in stating she is needing a refill on her medication and is out her next appt is not schd till 07/24/21.    Please call and advise

## 2021-06-10 ENCOUNTER — Telehealth: Payer: Self-pay | Admitting: Lactation Services

## 2021-06-10 MED ORDER — SLYND 4 MG PO TABS: 4.0000 mg | ORAL_TABLET | Freq: Every day | ORAL | 0 refills | Status: DC

## 2021-06-10 NOTE — Telephone Encounter (Signed)
Per chart review patient was prescribed a years worth of Slynd. She reports she is on her last packet and needs more to get through until her appointment in October.   Spoke with Dr. Vergie Living and he approved 2 months worth to get her through until her next appointment.   Called patient with assistance of Eda Royal, Spanish Interpreter and patient declined wanting an interpreter and would like to speak directly to the nurse. Was able to understand patient very well.   Patient is taking Slynd as prescribed, most like will run out due to some months having up to 31 days. Informed her that refill has been sent to get her through until her appointment. Patient voiced understanding.

## 2021-07-24 ENCOUNTER — Encounter: Payer: Self-pay | Admitting: Obstetrics and Gynecology

## 2021-07-24 ENCOUNTER — Other Ambulatory Visit: Payer: Self-pay | Admitting: Obstetrics and Gynecology

## 2021-07-24 ENCOUNTER — Other Ambulatory Visit: Payer: Self-pay

## 2021-07-24 ENCOUNTER — Ambulatory Visit (INDEPENDENT_AMBULATORY_CARE_PROVIDER_SITE_OTHER): Payer: Medicaid Other | Admitting: Obstetrics and Gynecology

## 2021-07-24 VITALS — BP 157/97 | HR 70 | Wt 166.4 lb

## 2021-07-24 DIAGNOSIS — Z01419 Encounter for gynecological examination (general) (routine) without abnormal findings: Secondary | ICD-10-CM

## 2021-07-24 MED ORDER — SECNIDAZOLE 2 G PO PACK: 1.0000 | PACK | Freq: Once | ORAL | 1 refills | Status: AC

## 2021-07-24 MED ORDER — SLYND 4 MG PO TABS: 1.0000 | ORAL_TABLET | Freq: Every day | ORAL | 3 refills | Status: DC

## 2021-07-24 NOTE — Progress Notes (Signed)
Obstetrics and Gynecology Annual Patient Evaluation  Appointment Date: 07/24/2021  OBGYN Clinic: [facility]  Primary Care Provider: None  Chief Complaint: Annual exam for [facility] Medicaid   History of Present Illness: Gabriela Donovan is a 40 y.o. [id number] seen for the above chief complaint. Her past medical history is significant for HTN, BMI 30s   She is endorsing itching and d/c and states it always ahppens this time of year with the season change and last year took a medication she got from her [place] that started with "se".  She had some AUB in august but had normal, qmonth bleeding for approximately one week with no intermenstrual bleeding; patient unsure when her period was in september  Review of Systems: Pertinent items noted in HPI and remainder of comprehensive ROS otherwise negative.   Past Medical History:  Past Medical History:  Diagnosis Date   Essential hypertension 03/10/2015   Gestational diabetes     Past Surgical History:  Past Surgical History:  Procedure Laterality Date   CHOLECYSTECTOMY      Past Obstetrical History:  OB History  Gravida Para Term Preterm AB Living  3 2 2   1 2   SAB IAB Ectopic Multiple Live Births  1     0 2    # Outcome Date GA Lbr Len/2nd Weight Sex Delivery Anes PTL Lv  3 Term 03/11/15 [redacted]w[redacted]d 01:53 / 00:07 6 lb 1 oz (2.75 kg) M Vag-Spont EPI  LIV     Birth Comments: No anomalies noted  2 Term 01/2009 [redacted]w[redacted]d  5 lb 11 oz (2.58 kg) F Vag-Spont  N LIV  1 SAB             Past Gynecological History: As per HPI. History of Pap Smear(s): Yes.  Last pap 2020, which was cytology and hpv negative She is currently using oral progesterone-only contraceptive for contraception.   Social History:  Social History   Socioeconomic History   Marital status: Married    Spouse name: Not on file   Number of children: Not on file   Years of education: Not on file   Highest education level: Not on file   Occupational History   Not on file  Tobacco Use   Smoking status: Never   Smokeless tobacco: Never  Vaping Use   Vaping Use: Never used  Substance and Sexual Activity   Alcohol use: No   Drug use: No   Sexual activity: Yes    Birth control/protection: None  Other Topics Concern   Not on file  Social History Narrative   Not on file   Social Determinants of Health   Financial Resource Strain: Not on file  Food Insecurity: Not on file  Transportation Needs: Not on file  Physical Activity: Not on file  Stress: Not on file  Social Connections: Not on file  Intimate Partner Violence: Not on file    Family History:  Family History  Problem Relation Age of Onset   Diabetes Mother    Hypertension Mother    Hypertension Father     Health Maintenance:  Mammogram(s): Yes.   Date: 08/2020  Medications Gabriela Donovan had no medications administered during this visit. Current Outpatient Medications  Medication Sig Dispense Refill   Drospirenone (SLYND) 4 MG TABS Take 1 tablet by mouth daily. 90 tablet 3   No current facility-administered medications for this visit.    Allergies Diflucan [fluconazole]   Physical Exam:  BP (!) 157/97  Pulse 70   Wt 166 lb 6.4 oz (75.5 kg)   LMP  (LMP Unknown) Comment: Patient does not remember LMP  BMI 28.56 kg/m  Body mass index is 28.56 kg/m.  General appearance: Well nourished, well developed female in no acute distress.  Neck:  Supple, normal appearance, and no thyromegaly  Cardiovascular: normal s1 and s2.  No murmurs, rubs or gallops. Respiratory:  Clear to auscultation bilateral. Normal respiratory effort Abdomen: positive bowel sounds and no masses, hernias; diffusely non tender to palpation, non distended Breasts: patient declines to have breast exam. Neuro/Psych:  Normal mood and affect.  Skin:  Warm and dry.  Lymphatic:  No inguinal lymphadenopathy.   Pelvic exam: is not limited by body habitus EGBUS: within normal  limits Vagina: within normal limits and with no blood. +white/yellowish d/c in vault Cervix: normal appearing cervix without tenderness, discharge or lesions. Uterus:  nonenlarged and non tender Adnexa:  normal adnexa and no mass, fullness, tenderness Rectovaginal: deferred  Laboratory: none  Radiology: none  Assessment: pt stable  Plan:  Information given to find a PCP and importance stressed. Contraceptive options d/w her and pt would like to continue on POPs BCCCP d/w pt  Rx for Secnidazole given to patient  RTC PRN  Cornelia Copa MD Attending Center for [employer] [facility])

## 2021-07-24 NOTE — Patient Instructions (Signed)
Call community health and wellness to get set up with a primary care doctor and appointment  [phone]

## 2021-08-03 ENCOUNTER — Other Ambulatory Visit: Payer: Self-pay | Admitting: Obstetrics and Gynecology

## 2021-08-03 DIAGNOSIS — Z1231 Encounter for screening mammogram for malignant neoplasm of breast: Secondary | ICD-10-CM

## 2021-09-15 ENCOUNTER — Ambulatory Visit
Admission: RE | Admit: 2021-09-15 | Discharge: 2021-09-15 | Disposition: A | Discharge status: Home / Self Care | Payer: No Typology Code available for payment source | Source: Ambulatory Visit | Attending: Obstetrics and Gynecology | Admitting: Obstetrics and Gynecology

## 2021-09-15 ENCOUNTER — Ambulatory Visit: Payer: Self-pay | Admitting: *Deleted

## 2021-09-15 ENCOUNTER — Other Ambulatory Visit: Payer: Self-pay

## 2021-09-15 VITALS — BP 120/76 | Wt 164.1 lb

## 2021-09-15 DIAGNOSIS — Z1231 Encounter for screening mammogram for malignant neoplasm of breast: Secondary | ICD-10-CM

## 2021-09-15 DIAGNOSIS — Z1239 Encounter for other screening for malignant neoplasm of breast: Secondary | ICD-10-CM

## 2021-09-15 NOTE — Patient Instructions (Signed)
Explained breast self awareness with Gabriela Donovan. Patient did not need a Pap smear today due to last Pap smear and HPV typing was 08/03/2019. Let her know BCCCP will cover Pap smears and HPV typing every 5 years unless has a history of abnormal Pap smears. Referred patient to the [facility] for a screening mammogram on mobile unit. Appointment scheduled Tuesday, September 15, 2021 at 1120. Patient escorted to the mobile unit following BCCCP appointment for her screening mammogram. Let patient know the Breast Center will follow up with her within the next couple weeks with results of her mammogram by letter or phone. Gabriela Donovan verbalized understanding.  Charleton Deyoung, Kathaleen Maser, RN 10:38 AM

## 2021-09-15 NOTE — Progress Notes (Signed)
[name] is a 40 y.o. female who presents to [facility] clinic today with no complaints.    Pap Smear: Pap smear not completed today. Last Pap smear was 08/03/2019 at [facility] and was normal with negative HPV. Per patient has no history of an abnormal Pap smear. Last Pap smear result is available in Epic.   Physical exam: Breasts Breasts symmetrical. No skin abnormalities bilateral breasts. No nipple retraction bilateral breasts. No nipple discharge bilateral breasts. No lymphadenopathy. No lumps palpated bilateral breasts. No complaints of pain or tenderness on exam.  MS DIGITAL SCREENING TOMO BILATERAL  Result Date: 08/25/2020 CLINICAL DATA:  Screening. EXAM: DIGITAL SCREENING BILATERAL MAMMOGRAM WITH TOMO AND CAD COMPARISON:  None. ACR Breast Density Category b: There are scattered areas of fibroglandular density. FINDINGS: There are no findings suspicious for malignancy. Images were processed with CAD. IMPRESSION: No mammographic evidence of malignancy. A result letter of this screening mammogram will be mailed directly to the patient. RECOMMENDATION: Screening mammogram in one year. (Code:SM-B-01Y) BI-RADS CATEGORY  1: Negative. Electronically Signed   By: Norva Pavlov M.D.   On: 08/25/2020 09:37         Pelvic/Bimanual Pap is not indicated today per BCCCP guidelines.   Smoking History: Patient has never smoked.   Patient Navigation: Patient education provided. Access to services provided for patient through BCCCP program.    Breast and Cervical Cancer Risk Assessment: Patient does not have family history of breast cancer, known genetic mutations, or radiation treatment to the chest before age 38. Patient does not have history of cervical dysplasia, immunocompromised, or DES exposure in-utero.  Risk Assessment     Risk Scores       09/15/2021   Last edited by: Narda Rutherford, LPN   5-year risk: 0.4 %   Lifetime risk: 7.8 %             A: BCCCP exam without pap smear No complaints.  P: Referred patient to the [facility] for a screening mammogram on mobile unit. Appointment scheduled Tuesday, September 15, 2021 at 1120.  Priscille Heidelberg, RN 09/15/2021 10:38 AM

## 2022-07-05 ENCOUNTER — Other Ambulatory Visit: Payer: Self-pay | Admitting: Obstetrics and Gynecology

## 2022-07-07 ENCOUNTER — Encounter: Payer: Self-pay | Admitting: Obstetrics and Gynecology

## 2022-08-09 ENCOUNTER — Encounter: Payer: Self-pay | Admitting: Obstetrics and Gynecology

## 2022-08-09 ENCOUNTER — Ambulatory Visit (INDEPENDENT_AMBULATORY_CARE_PROVIDER_SITE_OTHER): Payer: Medicaid Other | Admitting: Obstetrics and Gynecology

## 2022-08-09 VITALS — BP 140/96 | HR 70 | Ht 64.0 in | Wt 169.0 lb

## 2022-08-09 DIAGNOSIS — Z01419 Encounter for gynecological examination (general) (routine) without abnormal findings: Secondary | ICD-10-CM

## 2022-08-09 DIAGNOSIS — L709 Acne, unspecified: Secondary | ICD-10-CM

## 2022-08-09 MED ORDER — SLYND 4 MG PO TABS: 1.0000 | ORAL_TABLET | Freq: Every day | ORAL | 1 refills | Status: DC

## 2022-08-09 MED ORDER — BENZOYL PEROXIDE 2.5 % EX CREA: 1.0000 | TOPICAL_CREAM | Freq: Two times a day (BID) | CUTANEOUS | 0 refills | Status: AC

## 2022-08-09 NOTE — Progress Notes (Signed)
\ Obstetrics and Gynecology Annual Patient Evaluation  Appointment Date: 08/09/2022  OBGYN Clinic: Center for [facility]   Chief Complaint: Annual  History of Present Illness: Gabriela Donovan is a 41 y.o. [id number]  seen for the above chief complaint. Her past medical history is significant for HTN   Patient on Slynd for past few years and no problems or issues.   Only issue, today, is she notes acne on her cheeks at the end of the month before her period is to come on. Started about a year ago. ?started her BP medicine around that time   Review of Systems: Pertinent items noted in HPI and remainder of comprehensive ROS otherwise negative.   Past Medical History:  Past Medical History:  Diagnosis Date   Essential hypertension 03/10/2015   Gestational diabetes     Past Surgical History:  Past Surgical History:  Procedure Laterality Date   CHOLECYSTECTOMY      Past Obstetrical History:  OB History  Gravida Para Term Preterm AB Living  3 2 2   1 2   SAB IAB Ectopic Multiple Live Births  1     0 2    # Outcome Date GA Lbr Len/2nd Weight Sex Delivery Anes PTL Lv  3 Term 03/11/15 [redacted]w[redacted]d 01:53 / 00:07 6 lb 1 oz (2.75 kg) M Vag-Spont EPI  LIV     Birth Comments: No anomalies noted  2 Term 01/2009 [redacted]w[redacted]d  5 lb 11 oz (2.58 kg) F Vag-Spont  N LIV  1 SAB             Past Gynecological History: As per HPI. History of Pap Smear(s): Yes.  Last pap 2020, which was cytology and hpv negative She is currently using oral progesterone-only contraceptive for contraception.   Social History:  Social History   Socioeconomic History   Marital status: Married    Spouse name: Not on file   Number of children: 2   Years of education: Not on file   Highest education level: Bachelor's degree (e.g., BA, AB, BS)  Occupational History   Not on file  Tobacco Use   Smoking status: Never   Smokeless tobacco: Never  Vaping Use   Vaping Use: Never used  Substance and  Sexual Activity   Alcohol use: No   Drug use: No   Sexual activity: Yes    Birth control/protection: Pill  Other Topics Concern   Not on file  Social History Narrative   Not on file   Social Determinants of Health   Financial Resource Strain: Not on file  Food Insecurity: No Food Insecurity (09/15/2021)   Hunger Vital Sign    Worried About Running Out of Food in the Last Year: Never true    Ran Out of Food in the Last Year: Never true  Transportation Needs: No Transportation Needs (09/15/2021)   PRAPARE - 14/03/2021 (Medical): No    Lack of Transportation (Non-Medical): No  Physical Activity: Not on file  Stress: Not on file  Social Connections: Not on file  Intimate Partner Violence: Not on file    Family History:  Family History  Problem Relation Age of Onset   Diabetes Mother    Hypertension Mother    Hypertension Father     Medications Relena Inoa had no medications administered during this visit. Current Outpatient Medications  Medication Sig Dispense Refill   amlodipine-atorvastatin (CADUET) 10-10 MG tablet Take 1 tablet by mouth daily.  Drospirenone (SLYND) 4 MG TABS TAKE 1 TABLET BY MOUTH EVERY DAY 30 tablet 0   No current facility-administered medications for this visit.    Allergies Diflucan [fluconazole]   Physical Exam:  BP (!) 140/96   Pulse 70   Ht 5\' 4"  (1.626 m)   Wt 169 lb (76.7 kg)   LMP  (LMP Unknown) Comment: mayb period 2 months ago. Been spotting 2 months agao  BMI 29.01 kg/m  Body mass index is 29.01 kg/m. General appearance: Well nourished, well developed female in no acute distress.  Neck:  Supple, normal appearance, and no thyromegaly  Cardiovascular: normal s1 and s2.  No murmurs, rubs or gallops. Respiratory:  Clear to auscultation bilateral. Normal respiratory effort Abdomen: positive bowel sounds and no masses, hernias; diffusely non tender to palpation, non distended Breasts: breasts appear  normal, no suspicious masses, no skin or nipple changes or axillary nodes, and normal palpation. Neuro/Psych:  Normal mood and affect.  Skin:  Warm and dry.  Lymphatic:  No inguinal lymphadenopathy.   Pelvic exam: is not limited by body habitus EGBUS: within normal limits Vagina: within normal limits and with no blood or discharge in the vault Cervix: normal appearing cervix without tenderness, discharge or lesions. Uterus:  nonenlarged and non tender Adnexa:  normal adnexa and no mass, fullness, tenderness Rectovaginal: deferred  Laboratory: none  Radiology: none  Assessment: patient stable  Plan:  1. Well woman exam with routine gynecological exam Slynd refilled BCCCP screening mammo neg 09/2021. Pt to schedule repeat later this year.   2. Acne, unspecified acne type Unlikely to be related to [facility], as this should help combat acne. Unsure if her BP medication could be a contributor so I recommended she touch base with her PCP to see. Topical benzoyl peroxide sent in   RTC 1 year  Aletha Halim, Brooke Bonito MD Attending Center for [employer] manager)

## 2022-08-16 ENCOUNTER — Other Ambulatory Visit: Payer: Self-pay | Admitting: Obstetrics and Gynecology

## 2022-08-16 DIAGNOSIS — Z1231 Encounter for screening mammogram for malignant neoplasm of breast: Secondary | ICD-10-CM

## 2022-08-18 ENCOUNTER — Other Ambulatory Visit: Payer: Self-pay

## 2022-08-18 DIAGNOSIS — Z1231 Encounter for screening mammogram for malignant neoplasm of breast: Secondary | ICD-10-CM

## 2022-09-10 DIAGNOSIS — Z419 Encounter for procedure for purposes other than remedying health state, unspecified: Secondary | ICD-10-CM | POA: Diagnosis not present

## 2022-09-28 ENCOUNTER — Ambulatory Visit: Payer: No Typology Code available for payment source

## 2022-10-11 DIAGNOSIS — Z419 Encounter for procedure for purposes other than remedying health state, unspecified: Secondary | ICD-10-CM | POA: Diagnosis not present

## 2022-10-12 ENCOUNTER — Ambulatory Visit: Payer: No Typology Code available for payment source

## 2022-10-14 ENCOUNTER — Other Ambulatory Visit: Payer: Self-pay | Admitting: Obstetrics and Gynecology

## 2022-10-14 ENCOUNTER — Ambulatory Visit: Payer: No Typology Code available for payment source

## 2022-10-14 ENCOUNTER — Ambulatory Visit
Admission: RE | Admit: 2022-10-14 | Discharge: 2022-10-14 | Disposition: A | Discharge status: Home / Self Care | Payer: Medicaid Other | Source: Ambulatory Visit | Attending: Obstetrics and Gynecology | Admitting: Obstetrics and Gynecology

## 2022-10-14 DIAGNOSIS — Z7689 Persons encountering health services in other specified circumstances: Secondary | ICD-10-CM | POA: Diagnosis not present

## 2022-10-14 DIAGNOSIS — Z1231 Encounter for screening mammogram for malignant neoplasm of breast: Secondary | ICD-10-CM

## 2022-11-11 DIAGNOSIS — Z419 Encounter for procedure for purposes other than remedying health state, unspecified: Secondary | ICD-10-CM | POA: Diagnosis not present

## 2022-11-17 ENCOUNTER — Telehealth: Payer: Self-pay | Admitting: Obstetrics and Gynecology

## 2022-11-17 NOTE — Telephone Encounter (Signed)
Refill on SLYND) 4 MG TABS

## 2022-11-17 NOTE — Telephone Encounter (Signed)
Called patient at number listed in chart and verified patient ID with full name and DOB. Patient informed me that she went to pick up her Slynd prescription refill and was informed that that was her last refill for the year. Patient was concerned as she stated that Dr. Ilda Basset typically orders a year's worth of this birth control for her.   Patient was seen for a wellness visit/annual visit with Dr. Ilda Basset on 08/09/22, where he prescribed a years worth of slynd--intial Rx of 180 tablet with 1 duplicate refill.   I called the [facility] pharmacy at [phone] at its number listed in the chart and spoke with pharmacy personnel Minette Brine; she informed me that their pharmacy can't dispense 180 tablets at a time, so the system changed the Rx to 84 tablets with 2 refills (which is only a 6 month supply total). Because it was on their end, Minette Brine stated that she has now adjusted the Rx to have 3 more refills in order to cover the full year's worth of the original Rx.   Called patient again at same number from before, and informed her of the mistake made d/t how the Rx was received, and informed her that the pharmacy has fixed the issue on their end. Advised patient to call pharmacy to confirm as she gets closer to her next refill need, and that if she runs into any other issues, she can reach out to the office.  Patient verbalized understanding and had no further question or concerns.

## 2022-12-10 DIAGNOSIS — Z419 Encounter for procedure for purposes other than remedying health state, unspecified: Secondary | ICD-10-CM | POA: Diagnosis not present

## 2022-12-20 ENCOUNTER — Telehealth: Payer: Self-pay

## 2022-12-20 NOTE — Telephone Encounter (Signed)
Mychart msg sent

## 2023-01-10 DIAGNOSIS — Z419 Encounter for procedure for purposes other than remedying health state, unspecified: Secondary | ICD-10-CM | POA: Diagnosis not present

## 2023-02-09 DIAGNOSIS — Z419 Encounter for procedure for purposes other than remedying health state, unspecified: Secondary | ICD-10-CM | POA: Diagnosis not present

## 2023-03-12 DIAGNOSIS — Z419 Encounter for procedure for purposes other than remedying health state, unspecified: Secondary | ICD-10-CM | POA: Diagnosis not present

## 2023-04-11 DIAGNOSIS — Z419 Encounter for procedure for purposes other than remedying health state, unspecified: Secondary | ICD-10-CM | POA: Diagnosis not present

## 2023-05-12 DIAGNOSIS — Z419 Encounter for procedure for purposes other than remedying health state, unspecified: Secondary | ICD-10-CM | POA: Diagnosis not present

## 2023-06-12 DIAGNOSIS — Z419 Encounter for procedure for purposes other than remedying health state, unspecified: Secondary | ICD-10-CM | POA: Diagnosis not present

## 2023-06-29 DIAGNOSIS — M5441 Lumbago with sciatica, right side: Secondary | ICD-10-CM | POA: Diagnosis not present

## 2023-06-29 DIAGNOSIS — M5442 Lumbago with sciatica, left side: Secondary | ICD-10-CM | POA: Diagnosis not present

## 2023-07-12 DIAGNOSIS — Z419 Encounter for procedure for purposes other than remedying health state, unspecified: Secondary | ICD-10-CM | POA: Diagnosis not present

## 2023-07-19 ENCOUNTER — Other Ambulatory Visit: Payer: Self-pay | Admitting: Obstetrics and Gynecology

## 2023-07-21 ENCOUNTER — Encounter: Payer: Self-pay | Admitting: Obstetrics and Gynecology

## 2023-07-21 ENCOUNTER — Other Ambulatory Visit: Payer: Self-pay

## 2023-07-21 MED ORDER — SLYND 4 MG PO TABS: 1.0000 | ORAL_TABLET | Freq: Every day | ORAL | 0 refills | Status: DC

## 2023-08-12 ENCOUNTER — Encounter: Payer: Self-pay | Admitting: Obstetrics and Gynecology

## 2023-08-12 ENCOUNTER — Other Ambulatory Visit: Payer: Self-pay

## 2023-08-12 ENCOUNTER — Ambulatory Visit: Payer: Medicaid Other | Admitting: Obstetrics and Gynecology

## 2023-08-12 VITALS — BP 129/87 | HR 67 | Ht 63.0 in | Wt 168.3 lb

## 2023-08-12 DIAGNOSIS — Z01419 Encounter for gynecological examination (general) (routine) without abnormal findings: Secondary | ICD-10-CM | POA: Diagnosis not present

## 2023-08-12 DIAGNOSIS — Z3041 Encounter for surveillance of contraceptive pills: Secondary | ICD-10-CM

## 2023-08-12 DIAGNOSIS — Z1231 Encounter for screening mammogram for malignant neoplasm of breast: Secondary | ICD-10-CM

## 2023-08-12 DIAGNOSIS — Z419 Encounter for procedure for purposes other than remedying health state, unspecified: Secondary | ICD-10-CM | POA: Diagnosis not present

## 2023-08-12 MED ORDER — SLYND 4 MG PO TABS: 1.0000 | ORAL_TABLET | Freq: Every day | ORAL | 3 refills | Status: DC

## 2023-08-14 NOTE — Progress Notes (Signed)
Obstetrics and Gynecology Established Patient Evaluation  Appointment Date: 08/12/2023  OBGYN Clinic: Center for [facility]   Chief Complaint: OCP refill History of Present Illness: Gabriela Donovan is a 42 y.o. Hispanic [id number], seen for the above chief complaint. Her past medical history is significant for HTN.  Patient has been on Slynd/POPs for many years and is happy.    Review of Systems: A comprehensive review of systems was negative.   Patient Active Problem List   Diagnosis Date Noted   Essential hypertension 03/10/2015    Past Medical History:  Past Medical History:  Diagnosis Date   Essential hypertension 03/10/2015   Gestational diabetes     Past Surgical History:  Past Surgical History:  Procedure Laterality Date   CHOLECYSTECTOMY      Past Obstetrical History:  OB History  Gravida Para Term Preterm AB Living  3 2 2   1 2   SAB IAB Ectopic Multiple Live Births  1     0 2    # Outcome Date GA Lbr Len/2nd Weight Sex Type Anes PTL Lv  3 Term 03/11/15 [redacted]w[redacted]d 01:53 / 00:07 6 lb 1 oz (2.75 kg) M Vag-Spont EPI  LIV     Birth Comments: No anomalies noted  2 Term 01/2009 [redacted]w[redacted]d  5 lb 11 oz (2.58 kg) F Vag-Spont  N LIV  1 SAB            Past Gynecological History: As per HPI. Periods: qmonth, regular, not heavy/painful/prolonged History of Pap Smear(s): Yes.   Last pap: 2020 hpv and cytology negative  Social History:  Social History   Socioeconomic History   Marital status: Married    Spouse name: Not on file   Number of children: 2   Years of education: Not on file   Highest education level: Bachelor's degree (e.g., BA, AB, BS)  Occupational History   Not on file  Tobacco Use   Smoking status: Never   Smokeless tobacco: Never  Vaping Use   Vaping status: Never Used  Substance and Sexual Activity   Alcohol use: No   Drug use: No   Sexual activity: Yes    Birth control/protection: Pill  Other Topics Concern   Not on  file  Social History Narrative   Not on file   Social Determinants of Health   Financial Resource Strain: Not on file  Food Insecurity: No Food Insecurity (09/15/2021)   Hunger Vital Sign    Worried About Running Out of Food in the Last Year: Never true    Ran Out of Food in the Last Year: Never true  Transportation Needs: No Transportation Needs (09/15/2021)   PRAPARE - Administrator, Civil Service (Medical): No    Lack of Transportation (Non-Medical): No  Physical Activity: Not on file  Stress: Not on file  Social Connections: Not on file  Intimate Partner Violence: Not on file    Family History:  Family History  Problem Relation Age of Onset   Diabetes Mother    Hypertension Mother    Hypertension Father    Breast cancer Neg Hx     Medications Gabriela Donovan Gabriela Donovan "Gabriela Donovan" had no medications administered during this visit. Current Outpatient Medications  Medication Sig Dispense Refill   amlodipine-atorvastatin (CADUET) 10-10 MG tablet Take 1 tablet by mouth daily.     Benzoyl Peroxide 2.5 % CREA Apply 1 Application topically 2 (two) times daily. As needed 21 g 0   Drospirenone (  SLYND) 4 MG TABS Take 1 tablet (4 mg total) by mouth daily. 84 tablet 3   No current facility-administered medications for this visit.   Allergies Diflucan [fluconazole]   Physical Exam:  BP 129/87   Pulse 67   Ht 5\' 3"  (1.6 m)   Wt 168 lb 4.8 oz (76.3 kg)   BMI 29.81 kg/m  Body mass index is 29.81 kg/m. General appearance: Well nourished, well developed female in no acute distress.  Respiratory: Normal respiratory effort Breasts: patient declines to have breast exam. Neuro/Psych:  Normal mood and affect.   Pelvic exam: declined  Laboratory: none  Radiology: none  Assessment: patient doing well  Plan:  1. Encounter for screening mammogram for malignant neoplasm of breast - MM 3D SCREENING MAMMOGRAM BILATERAL BREAST; Future  2. Encounter for surveillance  of contraceptive pills Other options d/w her, such as paragard, NFP, vasectomy and BTL; pt would like to stay on Slynd. Refill sent in. Pap due next year d/w pt   Future Appointments  Date Time Provider Department Center  10/17/2023 10:30 AM [facility] MM 3 [facility]    Gabriela Copa MD Attending Center for [facility])

## 2023-09-11 DIAGNOSIS — Z419 Encounter for procedure for purposes other than remedying health state, unspecified: Secondary | ICD-10-CM | POA: Diagnosis not present

## 2023-10-12 DIAGNOSIS — Z419 Encounter for procedure for purposes other than remedying health state, unspecified: Secondary | ICD-10-CM | POA: Diagnosis not present

## 2023-10-17 ENCOUNTER — Ambulatory Visit: Payer: No Typology Code available for payment source

## 2023-11-04 ENCOUNTER — Ambulatory Visit
Admission: RE | Admit: 2023-11-04 | Discharge: 2023-11-04 | Disposition: A | Discharge status: Home / Self Care | Payer: Medicaid Other | Source: Ambulatory Visit | Attending: Obstetrics and Gynecology | Admitting: Obstetrics and Gynecology

## 2023-11-04 DIAGNOSIS — Z1231 Encounter for screening mammogram for malignant neoplasm of breast: Secondary | ICD-10-CM

## 2023-11-12 DIAGNOSIS — Z419 Encounter for procedure for purposes other than remedying health state, unspecified: Secondary | ICD-10-CM | POA: Diagnosis not present

## 2023-12-10 DIAGNOSIS — Z419 Encounter for procedure for purposes other than remedying health state, unspecified: Secondary | ICD-10-CM | POA: Diagnosis not present

## 2024-01-21 DIAGNOSIS — Z419 Encounter for procedure for purposes other than remedying health state, unspecified: Secondary | ICD-10-CM | POA: Diagnosis not present

## 2024-02-20 DIAGNOSIS — Z419 Encounter for procedure for purposes other than remedying health state, unspecified: Secondary | ICD-10-CM | POA: Diagnosis not present

## 2024-03-22 DIAGNOSIS — Z419 Encounter for procedure for purposes other than remedying health state, unspecified: Secondary | ICD-10-CM | POA: Diagnosis not present

## 2024-04-21 DIAGNOSIS — Z419 Encounter for procedure for purposes other than remedying health state, unspecified: Secondary | ICD-10-CM | POA: Diagnosis not present

## 2024-05-22 DIAGNOSIS — Z419 Encounter for procedure for purposes other than remedying health state, unspecified: Secondary | ICD-10-CM | POA: Diagnosis not present

## 2024-06-22 DIAGNOSIS — Z419 Encounter for procedure for purposes other than remedying health state, unspecified: Secondary | ICD-10-CM | POA: Diagnosis not present

## 2024-09-17 ENCOUNTER — Other Ambulatory Visit (HOSPITAL_COMMUNITY)
Admission: RE | Admit: 2024-09-17 | Discharge: 2024-09-17 | Disposition: A | Discharge status: Home / Self Care | Source: Ambulatory Visit | Attending: Obstetrics and Gynecology | Admitting: Obstetrics and Gynecology

## 2024-09-17 ENCOUNTER — Ambulatory Visit: Admitting: Obstetrics and Gynecology

## 2024-09-17 ENCOUNTER — Other Ambulatory Visit: Payer: Self-pay

## 2024-09-17 ENCOUNTER — Encounter: Payer: Self-pay | Admitting: Obstetrics and Gynecology

## 2024-09-17 VITALS — BP 132/90 | HR 69 | Wt 173.1 lb

## 2024-09-17 DIAGNOSIS — Z3041 Encounter for surveillance of contraceptive pills: Secondary | ICD-10-CM

## 2024-09-17 DIAGNOSIS — Z01419 Encounter for gynecological examination (general) (routine) without abnormal findings: Secondary | ICD-10-CM | POA: Diagnosis not present

## 2024-09-17 DIAGNOSIS — Z124 Encounter for screening for malignant neoplasm of cervix: Secondary | ICD-10-CM

## 2024-09-17 MED ORDER — PSYLLIUM 33 % PO POWD: ORAL | Status: AC

## 2024-09-17 MED ORDER — SLYND 4 MG PO TABS: 1.0000 | ORAL_TABLET | Freq: Every day | ORAL | 3 refills | Status: AC

## 2024-09-17 NOTE — Progress Notes (Signed)
 Obstetrics and Gynecology Established Patient Evaluation  Appointment Date: 08/12/2023  OBGYN Clinic: Center for [facility]   Chief Complaint: OCP refill History of Present Illness: Gabriela Donovan is a 43 y.o. Hispanic [id number], seen for the above chief complaint. Her past medical history is significant for HTN.  Patient has been on Slynd /POPs for many years and is happy but considering permanent sterilization for her. Also with some bloating for past few weeks to months.   No lower urinary tract s/s, melena, diarrhea. +constipation for which she's taking OTC Mg. ? Hot flashes  Review of Systems: A comprehensive review of systems was negative.   Patient Active Problem List   Diagnosis Date Noted   Essential hypertension 03/10/2015    Past Medical History:  Past Medical History:  Diagnosis Date   Essential hypertension 03/10/2015   Gestational diabetes     Past Surgical History:  Past Surgical History:  Procedure Laterality Date   CHOLECYSTECTOMY      Past Obstetrical History:  OB History  Gravida Para Term Preterm AB Living  3 2 2  1 2   SAB IAB Ectopic Multiple Live Births  1   0 2    # Outcome Date GA Lbr Len/2nd Weight Sex Type Anes PTL Lv  3 Term 03/11/15 [redacted]w[redacted]d 01:53 / 00:07 6 lb 1 oz (2.75 kg) M Vag-Spont EPI  LIV     Birth Comments: No anomalies noted  2 Term 01/2009 [redacted]w[redacted]d  5 lb 11 oz (2.58 kg) F Vag-Spont  N LIV  1 SAB            Past Gynecological History: As per HPI. Periods: unsure of LMP. Sometimes skips periods.  History of Pap Smear(s): Yes.   Last pap: 2020 hpv and cytology negative  Social History:  Social History   Socioeconomic History   Marital status: Married    Spouse name: Not on file   Number of children: 2   Years of education: Not on file   Highest education level: Bachelor's degree (e.g., BA, AB, BS)  Occupational History   Not on file  Tobacco Use   Smoking status: Never   Smokeless tobacco: Never   Vaping Use   Vaping status: Never Used  Substance and Sexual Activity   Alcohol use: No   Drug use: No   Sexual activity: Yes    Birth control/protection: Pill  Other Topics Concern   Not on file  Social History Narrative   Not on file   Social Drivers of Health   Financial Resource Strain: Not on file  Food Insecurity: No Food Insecurity (09/15/2021)   Hunger Vital Sign    Worried About Running Out of Food in the Last Year: Never true    Ran Out of Food in the Last Year: Never true  Transportation Needs: No Transportation Needs (09/15/2021)   PRAPARE - Administrator, Civil Service (Medical): No    Lack of Transportation (Non-Medical): No  Physical Activity: Not on file  Stress: Not on file  Social Connections: Not on file  Intimate Partner Violence: Not on file    Family History:  Family History  Problem Relation Age of Onset   Diabetes Mother    Hypertension Mother    Hypertension Father    Breast cancer Neg Hx     Medications Ardene Inoa Gabriela Donovan had no medications administered during this visit. Current Outpatient Medications  Medication Sig Dispense Refill   amlodipine-atorvastatin (CADUET) 10-10  MG tablet Take 1 tablet by mouth daily. (Patient not taking: Reported on 09/17/2024)     Benzoyl Peroxide  2.5 % CREA Apply 1 Application topically 2 (two) times daily. As needed (Patient not taking: Reported on 09/17/2024) 21 g 0   Drospirenone  (SLYND ) 4 MG TABS Take 1 tablet (4 mg total) by mouth daily. 84 tablet 3   No current facility-administered medications for this visit.   Allergies Diflucan  [fluconazole ]   Physical Exam:  BP (!) 132/90   Pulse 69   Wt 173 lb 1.6 oz (78.5 kg)   BMI 30.66 kg/m  Body mass index is 30.66 kg/m. General appearance: Well nourished, well developed female in no acute distress.  Respiratory: Normal respiratory effort Breasts: With CMA, breasts appear normal, no suspicious masses, no skin or nipple  changes or axillary nodes, and normal palpation. Abdomen: +BS, soft, nttp, nd Neuro/Psych:  Normal mood and affect.   Pelvic exam: normal external genitalia, vulva, vagina, cervix, uterus and adnexa, exam chaperoned by CMA  Laboratory: none  Radiology: none  Assessment: patient doing well  Plan:  1. Encounter for screening mammogram for malignant neoplasm of breast D/w pt that she's due for routine mammogram early 2026  2. Encounter for surveillance of contraceptive pills I d/w her re: contraception management. I told her that potential she is perimenopause but can't truly know until she comes off POP. She is interested in BTL but after d/w her, she declines this, as well as vasectomy. I d/w her re: risks with hormone contraception such as VTEs, HTN, potential malignancy risk, as well as NFP. I also d/w her re: trial of no hormones to see if she has a period and if she has increase in climateric s/s. Pt unsure when mom went through menopause. Pt to consider options but desires OCP refill.   3. Well Woman Exam No issues. Recommend fodmap, fiber, plenty of hydration   No future appointments.   Bebe Izell Raddle MD Attending Center for [employer] Midwife)

## 2024-09-19 LAB — CYTOLOGY - PAP
Comment: NEGATIVE
Diagnosis: NEGATIVE
High risk HPV: NEGATIVE

## 2024-09-20 ENCOUNTER — Ambulatory Visit: Payer: Self-pay | Admitting: Obstetrics and Gynecology

## 2024-09-21 ENCOUNTER — Other Ambulatory Visit: Payer: Self-pay | Admitting: Obstetrics and Gynecology

## 2024-09-21 DIAGNOSIS — Z1231 Encounter for screening mammogram for malignant neoplasm of breast: Secondary | ICD-10-CM

## 2024-11-05 ENCOUNTER — Ambulatory Visit

## 2024-11-07 ENCOUNTER — Ambulatory Visit
Admission: RE | Admit: 2024-11-07 | Discharge: 2024-11-07 | Disposition: A | Source: Ambulatory Visit | Attending: Obstetrics and Gynecology

## 2024-11-07 DIAGNOSIS — Z1231 Encounter for screening mammogram for malignant neoplasm of breast: Secondary | ICD-10-CM
